# Patient Record
Sex: Female | Born: 1970 | Race: White | Hispanic: No | Marital: Single | Smoking: Former smoker
Health system: Southern US, Community
[De-identification: ages and names within clinical notes are randomized; demographics above are authoritative.]

## PROBLEM LIST (undated history)

## (undated) DIAGNOSIS — I10 Essential (primary) hypertension: Secondary | ICD-10-CM

## (undated) DIAGNOSIS — E042 Nontoxic multinodular goiter: Secondary | ICD-10-CM

## (undated) HISTORY — DX: Nontoxic multinodular goiter: E04.2

---

## 2009-06-29 ENCOUNTER — Ambulatory Visit: Payer: Self-pay | Admitting: Internal Medicine

## 2010-07-05 LAB — TB SKIN TEST: TB Skin Test: NEGATIVE mm

## 2010-12-04 DIAGNOSIS — E042 Nontoxic multinodular goiter: Secondary | ICD-10-CM

## 2010-12-04 HISTORY — DX: Nontoxic multinodular goiter: E04.2

## 2010-12-22 ENCOUNTER — Ambulatory Visit: Payer: Self-pay | Admitting: Internal Medicine

## 2011-01-31 LAB — HM PAP SMEAR: HM Pap smear: NORMAL

## 2011-05-10 ENCOUNTER — Other Ambulatory Visit: Payer: Self-pay | Admitting: *Deleted

## 2011-05-10 NOTE — Telephone Encounter (Signed)
We need to get her chart to confirm medication.

## 2011-05-11 ENCOUNTER — Telehealth: Payer: Self-pay | Admitting: Internal Medicine

## 2011-05-11 NOTE — Telephone Encounter (Signed)
Since I don't have her records available to review,  The only I think I can do safely is to switch her to amlodipine 5 mg  One tablet daily  #30 with 3 refills.

## 2011-05-11 NOTE — Telephone Encounter (Signed)
Pt is about to run out of rx losartan 50mg   Price went from 6 to $50 is there anything else she can take Please advise pt

## 2011-05-11 NOTE — Telephone Encounter (Signed)
Patient called and stated she is running low on her losartan, but the price has went up to $50 a month.  She wanted to know if you could switch her to something similar.

## 2011-05-15 NOTE — Telephone Encounter (Signed)
We can try lisinopril 40 mg one tablet daily  #30 with 2 refills.

## 2011-05-15 NOTE — Telephone Encounter (Signed)
Notified patient since we did not have her records you would prescribe amlodipine.  She stated she has tried that before and it made her ankles swell.  I asked what other medications she has tried before and she stated losartan, amlodipine, and Micardis (her insurance would not cover anymore).  Please advise.

## 2011-05-16 NOTE — Telephone Encounter (Signed)
Pharmacist Thayer Ohm) called stating that the patient and he had called several times about trying to get her Losartan changed to something else. 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 Jama Flavors that a message was left for patient yesterday to call the office back. Thayer Ohm requested the order for the new prescription and stated that he will put it on hold until the patient calls the office back. . New RX given to Thayer Ohm to put on hold until the patient lets him know to fill this.

## 2011-08-08 ENCOUNTER — Other Ambulatory Visit: Payer: Self-pay | Admitting: Internal Medicine

## 2011-08-09 MED ORDER — LISINOPRIL 40 MG PO TABS: 40.0000 mg | ORAL_TABLET | Freq: Every day | ORAL | Status: DC

## 2011-08-16 ENCOUNTER — Encounter: Payer: Self-pay | Admitting: Internal Medicine

## 2011-08-16 ENCOUNTER — Ambulatory Visit (INDEPENDENT_AMBULATORY_CARE_PROVIDER_SITE_OTHER): Payer: Managed Care, Other (non HMO) | Admitting: Internal Medicine

## 2011-08-16 DIAGNOSIS — Z1239 Encounter for other screening for malignant neoplasm of breast: Secondary | ICD-10-CM

## 2011-08-16 DIAGNOSIS — E669 Obesity, unspecified: Secondary | ICD-10-CM

## 2011-08-16 DIAGNOSIS — E042 Nontoxic multinodular goiter: Secondary | ICD-10-CM

## 2011-08-16 DIAGNOSIS — G4700 Insomnia, unspecified: Secondary | ICD-10-CM

## 2011-08-16 DIAGNOSIS — D34 Benign neoplasm of thyroid gland: Secondary | ICD-10-CM

## 2011-08-16 DIAGNOSIS — E785 Hyperlipidemia, unspecified: Secondary | ICD-10-CM

## 2011-08-16 DIAGNOSIS — Z79899 Other long term (current) drug therapy: Secondary | ICD-10-CM

## 2011-08-16 DIAGNOSIS — Z124 Encounter for screening for malignant neoplasm of cervix: Secondary | ICD-10-CM

## 2011-08-16 LAB — BASIC METABOLIC PANEL
BUN: 16 mg/dL (ref 6–23)
Calcium: 8.8 mg/dL (ref 8.4–10.5)
GFR: 83.94 mL/min (ref >60.00)
Glucose, Bld: 91 mg/dL (ref 70–99)
Potassium: 4 mEq/L (ref 3.5–5.1)
Sodium: 139 mEq/L (ref 135–145)

## 2011-08-16 NOTE — Assessment & Plan Note (Signed)
Managed with occasional melatonin  300 mcg

## 2011-08-16 NOTE — Patient Instructions (Signed)
I recommend finding 20 minutes three times week to exercise .     EAS makes a protein shake 2.5 net carbs 110 cal  "Carb control"    Return in 6 months for a physical,  And do fasting labs a day or two prior

## 2011-08-17 ENCOUNTER — Encounter: Payer: Self-pay | Admitting: Internal Medicine

## 2011-08-17 DIAGNOSIS — E669 Obesity, unspecified: Secondary | ICD-10-CM | POA: Insufficient documentation

## 2011-08-17 DIAGNOSIS — Z124 Encounter for screening for malignant neoplasm of cervix: Secondary | ICD-10-CM | POA: Insufficient documentation

## 2011-08-17 DIAGNOSIS — E042 Nontoxic multinodular goiter: Secondary | ICD-10-CM | POA: Insufficient documentation

## 2011-08-17 NOTE — Assessment & Plan Note (Signed)
PAP was normal April 2012, previous one normal 2009

## 2011-08-17 NOTE — Assessment & Plan Note (Addendum)
By April 2012 ultrasound, no calcifications seen.  Normal thyroid function.  She was referred to Dr. Renae Fickle for managementof presume multinodular goiter  and is following up with her every 6 months .  Records not currently available but requested.

## 2011-08-17 NOTE — Progress Notes (Signed)
  Subjective:    Patient ID: Gabrielle Nelson, female    DOB: Apr 01, 1971, 40 y.o.   MRN: [id number]  HPI  Gabrielle Nelson is a 40 yo white female with a recent diagnosis of multinodular goiter, obesity hypertension, and mild hyperlipidemia who is here for 6 months followup.  She feels generally well, has no new issues other than weight gai.  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  She has occasional anxiety  issues but manages them without medications.  She is  not exercising regularly or following any particular diet to lose weight.    Past Medical History  Diagnosis Date  . Multiple thyroid nodules April  2012   Current Outpatient Prescriptions on File Prior to Visit  Medication Sig Dispense Refill  . lisinopril (PRINIVIL,ZESTRIL) 40 MG tablet Take 1 tablet (40 mg total) by mouth daily.  30 tablet  3     Review of Systems  Constitutional: Negative for fever, chills and unexpected weight change.  HENT: Negative for hearing loss, ear pain, nosebleeds, congestion, sore throat, facial swelling, rhinorrhea, sneezing, mouth sores, trouble swallowing, neck pain, neck stiffness, voice change, postnasal drip, sinus pressure, tinnitus and ear discharge.   Eyes: Negative for pain, discharge, redness and visual disturbance.  Respiratory: Negative for cough, chest tightness, shortness of breath, wheezing and stridor.   Cardiovascular: Negative for chest pain, palpitations and leg swelling.  Musculoskeletal: Negative for myalgias and arthralgias.  Skin: Negative for color change and rash.  Neurological: Negative for dizziness, weakness, light-headedness and headaches.  Hematological: Negative for adenopathy.       Objective:   Physical Exam  Constitutional: She is oriented to person, place, and time. Vital signs are normal. She appears well-developed and well-nourished.       obese  HENT:  Mouth/Throat: Oropharynx is clear and moist.  Eyes: EOM are  normal. Pupils are equal, round, and reactive to light. No scleral icterus.  Neck: Normal range of motion. Neck supple. No JVD present. Thyromegaly present.  Cardiovascular: Normal rate, regular rhythm, normal heart sounds and intact distal pulses.   Pulmonary/Chest: Effort normal and breath sounds normal.  Abdominal: Soft. Bowel sounds are normal. She exhibits no mass. There is no tenderness.  Musculoskeletal: Normal range of motion. She exhibits no edema.  Lymphadenopathy:    She has no cervical adenopathy.  Neurological: She is alert and oriented to person, place, and time.  Skin: Skin is warm and dry.  Psychiatric: She has a normal mood and affect.          Assessment & Plan:

## 2011-08-18 DIAGNOSIS — E785 Hyperlipidemia, unspecified: Secondary | ICD-10-CM | POA: Insufficient documentation

## 2011-08-18 NOTE — Assessment & Plan Note (Signed)
Her LDL was 145 in 2011,  With gaol of 100 due to history of hypertension.  Will repeat this month and recommend weight loss vs statin depending on how far beyond goal she is.

## 2011-11-08 ENCOUNTER — Encounter: Payer: Self-pay | Admitting: Internal Medicine

## 2011-11-13 ENCOUNTER — Other Ambulatory Visit: Payer: Self-pay | Admitting: Internal Medicine

## 2011-11-13 LAB — HM MAMMOGRAPHY: HM Mammogram: NORMAL

## 2011-11-13 MED ORDER — LISINOPRIL 40 MG PO TABS: 40.0000 mg | ORAL_TABLET | Freq: Every day | ORAL | Status: DC

## 2011-11-29 ENCOUNTER — Ambulatory Visit: Payer: Self-pay | Admitting: Internal Medicine

## 2011-12-08 ENCOUNTER — Encounter: Payer: Self-pay | Admitting: Internal Medicine

## 2011-12-18 ENCOUNTER — Other Ambulatory Visit: Payer: Self-pay | Admitting: Internal Medicine

## 2011-12-18 MED ORDER — LORATADINE 10 MG PO TABS: 10.0000 mg | ORAL_TABLET | Freq: Every day | ORAL | Status: DC

## 2012-01-03 ENCOUNTER — Ambulatory Visit (INDEPENDENT_AMBULATORY_CARE_PROVIDER_SITE_OTHER): Payer: Managed Care, Other (non HMO) | Admitting: Internal Medicine

## 2012-01-03 DIAGNOSIS — Z111 Encounter for screening for respiratory tuberculosis: Secondary | ICD-10-CM

## 2012-01-04 ENCOUNTER — Telehealth: Payer: Self-pay | Admitting: Internal Medicine

## 2012-01-04 NOTE — Telephone Encounter (Signed)
Patient is needing something for anxiety she has exams coming up.

## 2012-01-04 NOTE — Telephone Encounter (Signed)
Patient is getting ready to go through exams and she is asking if she can get something called in to help with her anxiety.

## 2012-01-05 LAB — TB SKIN TEST: TB Skin Test: NEGATIVE mm

## 2012-01-05 MED ORDER — ALPRAZOLAM 0.25 MG PO TABS: ORAL_TABLET | ORAL | Status: DC

## 2012-01-05 NOTE — Telephone Encounter (Signed)
Alprazolam 0.25 one tablet as needed for anxiety  #30 no refills

## 2012-01-05 NOTE — Telephone Encounter (Signed)
Patient notified of Rx.  

## 2012-01-05 NOTE — Telephone Encounter (Signed)
Rx called to pharmacy. Will notify patient when she comes in today for appt.

## 2012-02-09 ENCOUNTER — Other Ambulatory Visit: Payer: Self-pay | Admitting: Internal Medicine

## 2012-02-09 ENCOUNTER — Other Ambulatory Visit: Payer: Managed Care, Other (non HMO)

## 2012-02-09 MED ORDER — LISINOPRIL 40 MG PO TABS: 40.0000 mg | ORAL_TABLET | Freq: Every day | ORAL | Status: DC

## 2012-02-13 ENCOUNTER — Other Ambulatory Visit (INDEPENDENT_AMBULATORY_CARE_PROVIDER_SITE_OTHER): Payer: Managed Care, Other (non HMO) | Admitting: *Deleted

## 2012-02-13 DIAGNOSIS — E669 Obesity, unspecified: Secondary | ICD-10-CM

## 2012-02-13 DIAGNOSIS — E785 Hyperlipidemia, unspecified: Secondary | ICD-10-CM

## 2012-02-13 DIAGNOSIS — D34 Benign neoplasm of thyroid gland: Secondary | ICD-10-CM

## 2012-02-13 LAB — LIPID PANEL
Cholesterol: 192 mg/dL (ref 0–200)
Triglycerides: 115 mg/dL (ref 0.0–149.0)

## 2012-02-13 LAB — COMPREHENSIVE METABOLIC PANEL
AST: 16 U/L (ref 0–37)
Albumin: 3.6 g/dL (ref 3.5–5.2)
BUN: 14 mg/dL (ref 6–23)
Calcium: 8.6 mg/dL (ref 8.4–10.5)
Chloride: 108 mEq/L (ref 96–112)
Glucose, Bld: 79 mg/dL (ref 70–99)
Potassium: 3.8 mEq/L (ref 3.5–5.1)
Sodium: 141 mEq/L (ref 135–145)
Total Protein: 6.3 g/dL (ref 6.0–8.3)

## 2012-02-15 ENCOUNTER — Ambulatory Visit (INDEPENDENT_AMBULATORY_CARE_PROVIDER_SITE_OTHER): Payer: Managed Care, Other (non HMO) | Admitting: Internal Medicine

## 2012-02-15 ENCOUNTER — Encounter: Payer: Self-pay | Admitting: Internal Medicine

## 2012-02-15 VITALS — BP 116/68 | HR 69 | Temp 98.5°F | Resp 16 | Wt 209.0 lb

## 2012-02-15 DIAGNOSIS — Z0000 Encounter for general adult medical examination without abnormal findings: Secondary | ICD-10-CM

## 2012-02-15 DIAGNOSIS — E785 Hyperlipidemia, unspecified: Secondary | ICD-10-CM

## 2012-02-15 NOTE — Patient Instructions (Addendum)
Consider the Low Glycemic Index Diet and 6 smaller meals daily .  This boosts your metabolism and regulates your sugars:   7 AM Low carbohydrate Protein  Shakes (EAS Carb Control  Or Atkins ,  Available everywhere,   In  cases at BJs )  2.5 carbs  (Add or substitute a toasted sandwhich thin w/ peanut butter)  10 AM: Protein bar by Atkins (snack size,  Chocolate lover's variety at  BJ's)    Lunch: sandwich on pita bread or flatbread (Joseph's makes a pita bread and a flat bread , available at [employer] and BJ's; Toufayah makes a low carb flatbread available at [employer] and [employer]) Mission makes a low carb whole wheat tortilla available at BJs,and most grocery stores   3 PM:  Mid day :  Another protein bar,  Or a  cheese stick, 1/4 cup of almonds, walnuts, pistachios, pecans, peanuts,  Macadamia nuts  6 PM  Dinner:  "mean and green:"  Meat/chicken/fish, salad, and green veggie : use ranch, vinagrette,  Blue cheese, etc  9 PM snack : Breyer's low carb fudgsicle or  ice cream bar (Carb Smart), or  Weight Watcher's ice cream bar , or another protein shake  

## 2012-02-15 NOTE — Assessment & Plan Note (Addendum)
recommended trial of red yeast rice 600 mg twice daily

## 2012-02-18 ENCOUNTER — Encounter: Payer: Self-pay | Admitting: Internal Medicine

## 2012-02-18 NOTE — Progress Notes (Signed)
  Subjective:     Gabrielle Nelson is a 41 y.o. female here for a routine exam.  Current complaints:  none.  Personal health questionnaire reviewed: yes.   Gynecologic History Patient's last menstrual period was 01/29/2012. Contraception: vasectomy Last Pap: April 2012. Results were: normal Last mammogram: Dec 2012. Results were: normal  Obstetric History OB History    Grav Para Term Preterm Abortions TAB SAB Ect Mult Living                   The following portions of the patient's history were reviewed and updated as appropriate: allergies, current medications, past family history, past medical history, past social history, past surgical history and problem list.  Review of Systems A comprehensive review of systems was negative.    Objective:    BP 116/68  Pulse 69  Temp 98.5 F (36.9 C) (Oral)  Resp 16  Wt 209 lb (94.802 kg)  SpO2 98%  LMP 01/29/2012  General Appearance:    Alert, cooperative, no distress, appears stated age  Head:    Normocephalic, without obvious abnormality, atraumatic  Eyes:    PERRL, conjunctiva/corneas clear, EOM's intact, fundi    benign, both eyes  Ears:    Normal TM's and external ear canals, both ears  Nose:   Nares normal, septum midline, mucosa normal, no drainage    or sinus tenderness  Throat:   Lips, mucosa, and tongue normal; teeth and gums normal  Neck:   Supple, symmetrical, trachea midline, no adenopathy;    thyroid:  no enlargement/tenderness/nodules; no carotid   bruit or JVD  Back:     Symmetric, no curvature, ROM normal, no CVA tenderness  Lungs:     Clear to auscultation bilaterally, respirations unlabored  Chest Wall:    No tenderness or deformity   Heart:    Regular rate and rhythm, S1 and S2 normal, no murmur, rub   or gallop  Breast Exam:    No tenderness, masses, or nipple abnormality  Abdomen:     Soft, non-tender, bowel sounds active all four quadrants,    no masses, no organomegaly  Genitalia:    Normal female without  lesion, discharge or tenderness  Rectal:    Normal tone, normal prostate, no masses or tenderness;   guaiac negative stool  Extremities:   Extremities normal, atraumatic, no cyanosis or edema  Pulses:   2+ and symmetric all extremities  Skin:   Skin color, texture, turgor normal, no rashes or lesions  Lymph nodes:   Cervical, supraclavicular, and axillary nodes normal  Neurologic:   CNII-XII intact, normal strength, sensation and reflexes    throughout      Assessment:    Healthy female exam.    Plan:    Education reviewed: low fat, low cholesterol diet and weight bearing exercise.

## 2012-05-14 ENCOUNTER — Other Ambulatory Visit: Payer: Self-pay | Admitting: Internal Medicine

## 2012-05-14 MED ORDER — LISINOPRIL 40 MG PO TABS: 40.0000 mg | ORAL_TABLET | Freq: Every day | ORAL | Status: DC

## 2012-08-15 ENCOUNTER — Other Ambulatory Visit: Payer: Self-pay

## 2012-08-15 MED ORDER — LISINOPRIL 40 MG PO TABS: 40.0000 mg | ORAL_TABLET | Freq: Every day | ORAL | Status: DC

## 2012-08-15 NOTE — Telephone Encounter (Signed)
Refill request for Lisinopril 40 mg # 30 3 R sent to [facility]

## 2012-11-11 ENCOUNTER — Other Ambulatory Visit: Payer: Self-pay | Admitting: Internal Medicine

## 2012-11-12 NOTE — Telephone Encounter (Signed)
Med filled with no refills. Pt due for appt for additional.

## 2012-11-19 ENCOUNTER — Encounter: Payer: Managed Care, Other (non HMO) | Admitting: Adult Health

## 2012-11-25 ENCOUNTER — Encounter: Payer: Self-pay | Admitting: Adult Health

## 2012-11-25 ENCOUNTER — Ambulatory Visit (INDEPENDENT_AMBULATORY_CARE_PROVIDER_SITE_OTHER): Payer: 59 | Admitting: Adult Health

## 2012-11-25 VITALS — BP 130/87 | HR 87 | Temp 97.6°F | Resp 14 | Ht 64.0 in | Wt 209.0 lb

## 2012-11-25 DIAGNOSIS — Z23 Encounter for immunization: Secondary | ICD-10-CM

## 2012-11-25 DIAGNOSIS — Z020 Encounter for examination for admission to educational institution: Secondary | ICD-10-CM | POA: Insufficient documentation

## 2012-11-25 DIAGNOSIS — Z0000 Encounter for general adult medical examination without abnormal findings: Secondary | ICD-10-CM

## 2012-11-25 LAB — URINALYSIS, ROUTINE W REFLEX MICROSCOPIC
Specific Gravity, Urine: >=1.03 (ref 1.000–1.030)
Total Protein, Urine: NEGATIVE
Urine Glucose: NEGATIVE

## 2012-11-25 LAB — CBC
Platelets: 238 10*3/uL (ref 150.0–400.0)
RBC: 4.69 Mil/uL (ref 3.87–5.11)
WBC: 5.6 10*3/uL (ref 4.5–10.5)

## 2012-11-25 NOTE — Assessment & Plan Note (Addendum)
Patient had previous physical exam in May 2013. Normal findings on exam today. Vision test performed. Right eye 20/25, left eye 20/30. Administer PPD. She will need to return within 48-72 hours for results. She was also given Tdap vaccine today. Hepatitis B titers drawn. Patient also needed CBC and urinalysis. Physical examination forearm and immunization record form filled out for [employer].

## 2012-11-25 NOTE — Patient Instructions (Addendum)
  Tdap received today.  PPD given. Please return within 48-72 hours for reading. If this is not read within this time frame your PPD will need to be repeated.  Please have your labs drawn prior to leaving the office.

## 2012-11-25 NOTE — Addendum Note (Signed)
Addended by: Algis Downs on: 11/25/2012 01:20 PM   Modules accepted: Orders

## 2012-11-25 NOTE — Progress Notes (Signed)
  Subjective:    Patient ID: Gabrielle Nelson, female    DOB: [birth year], 42 y.o.   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 She does not have any immunization records with her. We do not have a record of her Tdap or hepatitis vaccine. Previous PPD done 01/2012.    Current Outpatient Prescriptions on File Prior to Visit  Medication Sig Dispense Refill  . fish oil-omega-3 fatty acids 1000 MG capsule Take 2 g by mouth daily.        Marland Kitchen glucosamine-chondroitin 500-400 MG tablet Take 1 tablet by mouth 3 (three) times daily.        Marland Kitchen lisinopril (PRINIVIL,ZESTRIL) 40 MG tablet TAKE ONE TABLET BY MOUTH EVERY DAY  90 tablet  0  . loratadine (CLARITIN) 10 MG tablet Take 1 tablet (10 mg total) by mouth daily.  90 tablet  3  . naproxen sodium (ANAPROX) 220 MG tablet Take 220 mg by mouth daily.       . ranitidine (ZANTAC) 150 MG capsule Take 150 mg by mouth 2 (two) times daily.         No current facility-administered medications on file prior to visit.     Review of Systems  Constitutional: Negative.   HENT: Negative.   Eyes: Negative.   Respiratory: Negative.   Cardiovascular: Negative.   Gastrointestinal: Negative.   Endocrine: Negative.   Genitourinary: Negative.   Musculoskeletal: Negative.   Skin: Negative.   Neurological: Negative.   Psychiatric/Behavioral: Negative.     BP 130/87  Pulse 87  Temp(Src) 97.6 F (36.4 C) (Oral)  Ht 5\' 4"  (1.626 m)  Wt 209 lb (94.802 kg)  BMI 35.86 kg/m2  SpO2 99%  LMP 11/17/2012    Objective:   Physical Exam  Constitutional: She is oriented to person, place, and time. She appears well-developed and well-nourished. No distress.  HENT:  Head: Normocephalic and atraumatic.  Eyes: Conjunctivae are normal. Pupils are equal, round, and reactive to light.  Neck: Normal range of motion. Neck supple.  Cardiovascular: Normal rate, regular rhythm and normal heart sounds.   Exam reveals no gallop.   No murmur heard. Pulmonary/Chest: Effort normal and breath sounds normal. No respiratory distress. She has no wheezes. She has no rales.  Abdominal: Soft. Bowel sounds are normal.  Lymphadenopathy:    She has no cervical adenopathy.  Neurological: She is alert and oriented to person, place, and time.  Skin: Skin is warm and dry.  Psychiatric: She has a normal mood and affect. Her behavior is normal. Judgment and thought content normal.       Assessment & Plan:

## 2012-11-26 LAB — HEPATITIS B SURFACE ANTIBODY,QUALITATIVE: Hep B S Ab: REACTIVE — AB

## 2012-12-24 ENCOUNTER — Other Ambulatory Visit: Payer: Self-pay | Admitting: *Deleted

## 2012-12-24 MED ORDER — LORATADINE 10 MG PO TABS: 10.0000 mg | ORAL_TABLET | Freq: Every day | ORAL | Status: DC

## 2013-02-07 ENCOUNTER — Other Ambulatory Visit: Payer: Self-pay | Admitting: Internal Medicine

## 2013-08-03 ENCOUNTER — Other Ambulatory Visit: Payer: Self-pay | Admitting: Internal Medicine

## 2013-11-03 ENCOUNTER — Other Ambulatory Visit: Payer: Self-pay | Admitting: Internal Medicine

## 2013-11-05 ENCOUNTER — Other Ambulatory Visit: Payer: Self-pay | Admitting: Internal Medicine

## 2013-12-02 ENCOUNTER — Other Ambulatory Visit: Payer: Self-pay | Admitting: Internal Medicine

## 2013-12-02 NOTE — Telephone Encounter (Signed)
Appt 01/08/14 

## 2013-12-29 ENCOUNTER — Other Ambulatory Visit: Payer: Self-pay | Admitting: *Deleted

## 2013-12-29 MED ORDER — LORATADINE 10 MG PO TABS: ORAL_TABLET | ORAL | Status: DC

## 2013-12-29 NOTE — Telephone Encounter (Signed)
Appt 01/08/14

## 2014-01-08 ENCOUNTER — Encounter: Payer: Self-pay | Admitting: Internal Medicine

## 2014-01-08 ENCOUNTER — Ambulatory Visit (INDEPENDENT_AMBULATORY_CARE_PROVIDER_SITE_OTHER): Payer: BC Managed Care – PPO | Admitting: Internal Medicine

## 2014-01-08 ENCOUNTER — Other Ambulatory Visit: Payer: Self-pay | Admitting: Internal Medicine

## 2014-01-08 ENCOUNTER — Encounter (INDEPENDENT_AMBULATORY_CARE_PROVIDER_SITE_OTHER): Payer: Self-pay

## 2014-01-08 VITALS — BP 120/80 | HR 67 | Temp 98.0°F | Ht 64.75 in | Wt 200.0 lb

## 2014-01-08 DIAGNOSIS — Z0000 Encounter for general adult medical examination without abnormal findings: Secondary | ICD-10-CM

## 2014-01-08 DIAGNOSIS — R5383 Other fatigue: Principal | ICD-10-CM

## 2014-01-08 DIAGNOSIS — E669 Obesity, unspecified: Secondary | ICD-10-CM

## 2014-01-08 DIAGNOSIS — E785 Hyperlipidemia, unspecified: Secondary | ICD-10-CM

## 2014-01-08 DIAGNOSIS — R5381 Other malaise: Secondary | ICD-10-CM

## 2014-01-08 LAB — COMPREHENSIVE METABOLIC PANEL
ALT: 10 U/L (ref 0–35)
AST: 14 U/L (ref 0–37)
Albumin: 3.7 g/dL (ref 3.5–5.2)
Alkaline Phosphatase: 46 U/L (ref 39–117)
BILIRUBIN TOTAL: 0.4 mg/dL (ref 0.2–1.2)
BUN: 18 mg/dL (ref 6–23)
CO2: 26 mEq/L (ref 19–32)
CREATININE: 0.8 mg/dL (ref 0.4–1.2)
Calcium: 8.8 mg/dL (ref 8.4–10.5)
Chloride: 106 mEq/L (ref 96–112)
GFR: 82.98 mL/min (ref >60.00)
Glucose, Bld: 75 mg/dL (ref 70–99)
Potassium: 3.6 mEq/L (ref 3.5–5.1)
SODIUM: 138 meq/L (ref 135–145)
Total Protein: 6.3 g/dL (ref 6.0–8.3)

## 2014-01-08 LAB — LIPID PANEL
CHOL/HDL RATIO: 5
Cholesterol: 194 mg/dL (ref 0–200)
HDL: 42.1 mg/dL (ref >39.00)
LDL Cholesterol: 133 mg/dL — ABNORMAL HIGH (ref 0–99)
Triglycerides: 94 mg/dL (ref 0.0–149.0)
VLDL: 18.8 mg/dL (ref 0.0–40.0)

## 2014-01-08 LAB — CBC WITH DIFFERENTIAL/PLATELET
Basophils Absolute: 0 10*3/uL (ref 0.0–0.1)
Basophils Relative: 0.6 % (ref 0.0–3.0)
EOS PCT: 3.1 % (ref 0.0–5.0)
Eosinophils Absolute: 0.2 10*3/uL (ref 0.0–0.7)
HEMATOCRIT: 37.9 % (ref 36.0–46.0)
HEMOGLOBIN: 12.9 g/dL (ref 12.0–15.0)
Lymphocytes Relative: 31.2 % (ref 12.0–46.0)
Lymphs Abs: 1.6 10*3/uL (ref 0.7–4.0)
MCHC: 34.1 g/dL (ref 30.0–36.0)
MCV: 83.7 fl (ref 78.0–100.0)
MONO ABS: 0.3 10*3/uL (ref 0.1–1.0)
MONOS PCT: 6.1 % (ref 3.0–12.0)
NEUTROS ABS: 3 10*3/uL (ref 1.4–7.7)
Neutrophils Relative %: 59 % (ref 43.0–77.0)
Platelets: 220 10*3/uL (ref 150.0–400.0)
RBC: 4.53 Mil/uL (ref 3.87–5.11)
RDW: 13.5 % (ref 11.5–15.5)
WBC: 5 10*3/uL (ref 4.0–10.5)

## 2014-01-08 LAB — TSH: TSH: 0.67 u[IU]/mL (ref 0.35–4.50)

## 2014-01-08 NOTE — Progress Notes (Signed)
Pre visit review using our clinic review tool, if applicable. No additional management support is needed unless otherwise documented below in the visit note. 

## 2014-01-08 NOTE — Assessment & Plan Note (Signed)
I have addressed  BMI and recommended a low glycemic index diet utilizing smaller more frequent meals to increase metabolism.  I have also recommended that patient start exercising with a goal of 30 minutes of aerobic exercise a minimum of 5 days per week. Screening for lipid disorders, thyroid and diabetes to be done today.   

## 2014-01-08 NOTE — Patient Instructions (Signed)
You had your annual  wellness exam today.  We will repeat your PAP smear  This year    We will schedule your mammogram soon at [facility].  We will contact you with the bloodwork results  This is  One version of a  "Low GI"  Diet:  It will still lower your blood sugars and allow you to lose 4 to 8  lbs  per month if you follow it carefully.  Your goal with exercise is a minimum of 30 minutes of aerobic exercise 5 days per week (Walking does not count once it becomes easy!)    All of the foods can be found at grocery stores and in bulk at [employer].  The Atkins protein bars and shakes are available in more varieties at Target, WalMart and [place].     7 AM Breakfast:  Choose from the following:  Low carbohydrate Protein  Shakes (I recommend the EAS AdvantEdge "Carb Control" shakes  Or the low carb shakes by Atkins.    2.5 carbs   Arnold's "Sandwhich Thin"toasted  w/ peanut butter (no jelly: about 20 net carbs  "Bagel Thin" with cream cheese and salmon: about 20 carbs   a scrambled egg/bacon/cheese burrito made with Mission's "carb balance" whole wheat tortilla  (about 10 net carbs )   Avoid cereal and bananas, oatmeal and cream of wheat and grits. They are loaded with carbohydrates!   10 AM: high protein snack  Protein bar by Atkins (the snack size, under 200 cal, usually < 6 net carbs).    A stick of cheese:  Around 1 carb,  100 cal     Dannon Light n Fit [place] Yogurt  (80 cal, 8 carbs)  Other so called "protein bars" and Greek yogurts tend to be loaded with carbohydrates.  Remember, in food advertising, the word "energy" is synonymous for " carbohydrate."  Lunch:   A Sandwich using the bread choices listed, Can use any  Eggs,  lunchmeat, grilled meat or canned tuna), avocado, regular mayo/mustard  and cheese.  A Salad using blue cheese, ranch,  Goddess or vinagrette,  No croutons or "confetti" and no "candied nuts" but regular nuts OK.   No pretzels or chips.  Pickles and miniature  sweet peppers are a good low carb alternative that provide a "crunch"  The bread is the only source of carbohydrate in a sandwich and  can be decreased by trying some of these alternatives to traditional loaf bread  Joseph's makes a pita bread and a flat bread that are 50 cal and 4 net carbs available at [place] and [place].  This can be toasted to use with hummous as well  Toufayan makes a low carb flatbread that's 100 cal and 9 net carbs available at [employer] and BJ's makes 2 sizes of  Low carb whole wheat tortilla  (The large one is 210 cal and 6 net carbs) Avoid "Low fat dressings, as well as Barry Brunner and [place] dressings They are loaded with sugar!   3 PM/ Mid day  Snack:  Consider  1 ounce of  almonds, walnuts, pistachios, pecans, peanuts,  Macadamia nuts or a nut medley.  Avoid "granola"; the dried cranberries and raisins are loaded with carbohydrates. Mixed nuts as long as there are no raisins,  cranberries or dried fruit.     6 PM  Dinner:     Meat/fowl/fish with a green salad, and either broccoli, cauliflower, green beans, spinach, brussel sprouts  or  Lima beans. DO NOT BREAD THE PROTEIN!!      There is a low carb pasta by Dreamfield's that is acceptable and tastes great: only 5 digestible carbs/serving.( All grocery stores but BJs carry it )  Try Hurley Cisco Angelo's chicken piccata or chicken or eggplant parm over low carb pasta.(Lowes and BJs)   Marjory Lies Sanchez's "Carnitas" (pulled pork, no sauce,  0 carbs) or his beef pot roast to make a dinner burrito (at BJ's)  Pesto over low carb pasta (bj's sells a good quality pesto in the center refrigerated section of the deli   Whole wheat pasta is still full of digestible carbs and  Not as low in glycemic index as Dreamfield's.   Brown rice is still rice,  So skip the rice and noodles if you eat [place] or [place] (or at least limit to 1/2 cup)  9 PM snack :   Breyer's "low carb" fudgsicle or  ice cream bar (Carb Smart line), or   Weight Watcher's ice cream bar , or another "no sugar added" ice cream;  a serving of fresh berries/cherries with whipped cream   Cheese or DANNON'S LlGHT N FIT [place] YOGURT  Avoid bananas, pineapple, grapes  and watermelon on a regular basis because they are high in sugar.  THINK OF THEM AS DESSERT  Remember that snack Substitutions should be less than 10 NET carbs per serving and meals < 20 carbs. Remember to subtract fiber grams to get the "net carbs."

## 2014-01-11 NOTE — Progress Notes (Signed)
Patient ID: Gabrielle Nelson, female   DOB: 18-Feb-1971, 43 y.o.   MRN: [id number]  Subjective:     Gabrielle Nelson is a 43 y.o. female and is here for a comprehensive physical exam. The patient reports trouble losing weight.  History   Social History  . Marital Status: Single    Spouse Name: N/A    Number of Children: N/A  . Years of Education: N/A   Occupational History  . Not on file.   Social History Main Topics  . Smoking status: Former Smoker    Quit date: 08/15/2005  . Smokeless tobacco: Never Used  . Alcohol Use: No  . Drug Use: No  . Sexual Activity: Not on file   Other Topics Concern  . Not on file   Social History Narrative  . No narrative on file   Health Maintenance  Topic Date Due  . Pap Smear  08/16/2013  . Influenza Vaccine  04/04/2014  . Tetanus/tdap  11/26/2022    The following portions of the patient's history were reviewed and updated as appropriate: allergies, current medications, past family history, past medical history, past social history, past surgical history and problem list.  Review of Systems A comprehensive review of systems was negative.   Objective:  BP 120/80  Pulse 67  Temp(Src) 98 F (36.7 C) (Oral)  Ht 5' 4.75" (1.645 m)  Wt 200 lb (90.719 kg)  BMI 33.52 kg/m2  SpO2 98%  LMP 01/08/2014  General appearance: alert, cooperative and appears stated age Head: Normocephalic, without obvious abnormality, atraumatic Eyes: conjunctivae/corneas clear. PERRL, EOM's intact. Fundi benign. Ears: normal TM's and external ear canals both ears Nose: Nares normal. Septum midline. Mucosa normal. No drainage or sinus tenderness. Throat: lips, mucosa, and tongue normal; teeth and gums normal Neck: no adenopathy, no carotid bruit, no JVD, supple, symmetrical, trachea midline and thyroid not enlarged, symmetric, no tenderness/mass/nodules Lungs: clear to auscultation bilaterally Breasts: normal appearance, no masses or tenderness Heart: regular rate  and rhythm, S1, S2 normal, no murmur, click, rub or gallop Abdomen: soft, non-tender; bowel sounds normal; no masses,  no organomegaly Extremities: extremities normal, atraumatic, no cyanosis or edema Pulses: 2+ and symmetric Skin: Skin color, texture, turgor normal. No rashes or lesions Neurologic: Alert and oriented X 3, normal strength and tone. Normal symmetric reflexes. Normal coordination and gait.   .    Assessment and Plan:    Obesity (BMI 30-39.9) I have addressed  BMI and recommended a low glycemic index diet utilizing smaller more frequent meals to increase metabolism.  I have also recommended that patient start exercising with a goal of 30 minutes of aerobic exercise a minimum of 5 days per week. Screening for lipid disorders, thyroid and diabetes to be done today.    Routine general medical examination at a health care facility Annual comprehensive exam was done including breast, excluding pelvic and PAP smear since she is currently menstruating . All screenings have been addressed .    Updated Medication List Outpatient Encounter Prescriptions as of 01/08/2014  Medication Sig  . fish oil-omega-3 fatty acids 1000 MG capsule Take 1,200 mg by mouth 4 (four) times daily.   Marland Kitchen glucosamine-chondroitin 500-400 MG tablet Take 2 tablets by mouth daily.   . naproxen sodium (ANAPROX) 220 MG tablet Take 220 mg by mouth daily.   . ranitidine (ZANTAC) 150 MG capsule Take 150 mg by mouth daily.   . [DISCONTINUED] lisinopril (PRINIVIL,ZESTRIL) 40 MG tablet TAKE ONE TABLET BY MOUTH EVERY DAY  . [  DISCONTINUED] loratadine (CLARITIN) 10 MG tablet TAKE ONE TABLET BY MOUTH EVERY DAY

## 2014-01-11 NOTE — Assessment & Plan Note (Signed)
Annual comprehensive exam was done including breast, excluding pelvic and PAP smear since she is currently menstruating . All screenings have been addressed .

## 2014-01-12 ENCOUNTER — Encounter: Payer: Self-pay | Admitting: *Deleted

## 2014-02-19 ENCOUNTER — Other Ambulatory Visit (HOSPITAL_COMMUNITY)
Admission: RE | Admit: 2014-02-19 | Discharge: 2014-02-19 | Disposition: A | Discharge status: Home / Self Care | Payer: BC Managed Care – PPO | Source: Ambulatory Visit | Attending: Internal Medicine | Admitting: Internal Medicine

## 2014-02-19 ENCOUNTER — Ambulatory Visit (INDEPENDENT_AMBULATORY_CARE_PROVIDER_SITE_OTHER): Payer: BC Managed Care – PPO | Admitting: Internal Medicine

## 2014-02-19 ENCOUNTER — Encounter: Payer: Self-pay | Admitting: Internal Medicine

## 2014-02-19 VITALS — BP 116/72 | HR 76 | Temp 97.9°F | Resp 18 | Ht 64.75 in | Wt 205.0 lb

## 2014-02-19 DIAGNOSIS — R0989 Other specified symptoms and signs involving the circulatory and respiratory systems: Secondary | ICD-10-CM

## 2014-02-19 DIAGNOSIS — R0609 Other forms of dyspnea: Secondary | ICD-10-CM

## 2014-02-19 DIAGNOSIS — G4710 Hypersomnia, unspecified: Secondary | ICD-10-CM

## 2014-02-19 DIAGNOSIS — Z124 Encounter for screening for malignant neoplasm of cervix: Secondary | ICD-10-CM

## 2014-02-19 DIAGNOSIS — G4719 Other hypersomnia: Secondary | ICD-10-CM

## 2014-02-19 DIAGNOSIS — Z1151 Encounter for screening for human papillomavirus (HPV): Secondary | ICD-10-CM | POA: Insufficient documentation

## 2014-02-19 DIAGNOSIS — E669 Obesity, unspecified: Secondary | ICD-10-CM

## 2014-02-19 DIAGNOSIS — G4700 Insomnia, unspecified: Secondary | ICD-10-CM

## 2014-02-19 DIAGNOSIS — R0683 Snoring: Secondary | ICD-10-CM

## 2014-02-19 MED ORDER — ALPRAZOLAM 0.5 MG PO TABS: 0.5000 mg | ORAL_TABLET | Freq: Every evening | ORAL | Status: DC | PRN

## 2014-02-19 NOTE — Patient Instructions (Signed)
I am ordering a sleep study based on your symptoms of snoring,  Daytime fatigue and nonrestorative sleep  You can try the alprazolam at bedtime to help you fall asleep. Take it with you to the sleep lab on the night of your study   We will contact you with the results of your PAP smear next week

## 2014-02-19 NOTE — Progress Notes (Signed)
Pre-visit discussion using our clinic review tool. No additional management support is needed unless otherwise documented below in the visit note.  

## 2014-02-19 NOTE — Assessment & Plan Note (Addendum)
No improvement except with 10 melatonin which makes her groggy in the am/..  Told she snores,  No prior sleep study , has obesity, daytime fatigue and sleepiness and wakes up tired every am.  Sleep study ordered,  Alprazolam 0.5 mg gi ven

## 2014-02-19 NOTE — Assessment & Plan Note (Addendum)
PAP smear was done  today .  Exam appeared normal

## 2014-02-20 LAB — CYTOLOGY - PAP

## 2014-02-22 ENCOUNTER — Encounter: Payer: Self-pay | Admitting: Internal Medicine

## 2014-02-22 NOTE — Assessment & Plan Note (Signed)
With snoring, hypertension and fatigue. Discussed referral for sleep study

## 2014-02-22 NOTE — Progress Notes (Signed)
Patient ID: Gabrielle Nelson, female   DOB: 1971/01/10, 43 y.o.   MRN: [id number]   Patient Active Problem List   Diagnosis Date Noted  . School physical exam 11/25/2012  . Routine general medical examination at a health care facility 02/15/2012  . Other and unspecified hyperlipidemia 08/18/2011  . Pap smear for cervical cancer screening 08/17/2011  . Obesity (BMI 30-39.9) 08/17/2011  . Multiple thyroid nodules   . Insomnia 08/16/2011    Subjective:  CC:   Chief Complaint  Patient presents with  . Follow-up    from physical for pap.    HPI:   Gabrielle Nelson is a 43 y.o. female who presents for  PAP smear which was deferred last month during her annual PE due to menstruation.  New complaints of insomnia, and morning fatigue. Has history of snoring , weight gain, daytime fatigue    Past Medical History  Diagnosis Date  . Multiple thyroid nodules April  2012    History reviewed. No pertinent past surgical history.     The following portions of the patient's history were reviewed and updated as appropriate: Allergies, current medications, and problem list.    Review of Systems:   Patient denies headache, fevers, malaise, unintentional weight loss, skin rash, eye pain, sinus congestion and sinus pain, sore throat, dysphagia,  hemoptysis , cough, dyspnea, wheezing, chest pain, palpitations, orthopnea, edema, abdominal pain, nausea, melena, diarrhea, constipation, flank pain, dysuria, hematuria, urinary  Frequency, nocturia, numbness, tingling, seizures,  Focal weakness, Loss of consciousness,  Tremor, insomnia, depression, anxiety, and suicidal ideation.     History   Social History  . Marital Status: Single    Spouse Name: N/A    Number of Children: N/A  . Years of Education: N/A   Occupational History  . Not on file.   Social History Main Topics  . Smoking status: Former Smoker    Quit date: 08/15/2005  . Smokeless tobacco: Never Used  . Alcohol Use: No  . Drug  Use: No  . Sexual Activity: Not on file   Other Topics Concern  . Not on file   Social History Narrative  . No narrative on file    Objective:  Filed Vitals:   02/19/14 1108  BP: 116/72  Pulse: 76  Temp: 97.9 F (36.6 C)  Resp: 18   General Appearance:    Alert, cooperative, no distress, appears stated age  Head:    Normocephalic, without obvious abnormality, atraumatic  Eyes:    PERRL, conjunctiva/corneas clear, EOM's intact, fundi    benign, both eyes  Ears:    Normal TM's and external ear canals, both ears  Nose:   Nares normal, septum midline, mucosa normal, no drainage    or sinus tenderness  Throat:   Lips, mucosa, and tongue normal; teeth and gums normal  Neck:   Supple, symmetrical, trachea midline, no adenopathy;    thyroid:  no enlargement/tenderness/nodules; no carotid   bruit or JVD  Back:     Symmetric, no curvature, ROM normal, no CVA tenderness  Lungs:     Clear to auscultation bilaterally, respirations unlabored  Chest Wall:    No tenderness or deformity   Heart:    Regular rate and rhythm, S1 and S2 normal, no murmur, rub   or gallop  Breast Exam:    No tenderness, masses, or nipple abnormality  Abdomen:     Soft, non-tender, bowel sounds active all four quadrants,    no masses, no organomegaly  Genitalia:  Pelvic: cervix normal in appearance, external genitalia normal, no adnexal masses or tenderness, no cervical motion tenderness, rectovaginal septum normal, uterus normal size, shape, and consistency and vagina normal without discharge  Extremities:   Extremities normal, atraumatic, no cyanosis or edema  Pulses:   2+ and symmetric all extremities  Skin:   Skin color, texture, turgor normal, no rashes or lesions  Lymph nodes:   Cervical, supraclavicular, and axillary nodes normal  Neurologic:   CNII-XII intact, normal strength, sensation and reflexes    throughout   Assessment and Plan:  Pap smear for cervical cancer screening PAP smear was done   today .  Exam appeared normal  Insomnia No improvement except with 10 melatonin which makes her groggy in the am/..  Told she snores,  No prior sleep study , has obesity, daytime fatigue and sleepiness and wakes up tired every am.  Sleep study ordered,  Alprazolam 0.5 mg gi ven   Obesity (BMI 30-39.9) With snoring, hypertension and fatigue. Discussed referral for sleep study     Updated Medication List Outpatient Encounter Prescriptions as of 02/19/2014  Medication Sig  . fish oil-omega-3 fatty acids 1000 MG capsule Take 1,200 mg by mouth 4 (four) times daily.   Marland Kitchen glucosamine-chondroitin 500-400 MG tablet Take 2 tablets by mouth daily.   Marland Kitchen lisinopril (PRINIVIL,ZESTRIL) 40 MG tablet TAKE ONE TABLET BY MOUTH EVERY DAY  . loratadine (CLARITIN) 10 MG tablet TAKE ONE TABLET BY MOUTH EVERY DAY  . naproxen sodium (ANAPROX) 220 MG tablet Take 220 mg by mouth daily.   . ranitidine (ZANTAC) 150 MG capsule Take 150 mg by mouth daily.   Marland Kitchen ALPRAZolam (XANAX) 0.5 MG tablet Take 1 tablet (0.5 mg total) by mouth at bedtime as needed for anxiety or sleep.     Orders Placed This Encounter  Procedures  . Ambulatory referral to Sleep Studies    No Follow-up on file.

## 2014-02-23 LAB — HM PAP SMEAR: HM Pap smear: NORMAL

## 2014-02-24 ENCOUNTER — Encounter: Payer: Self-pay | Admitting: *Deleted

## 2014-06-05 ENCOUNTER — Other Ambulatory Visit: Payer: Self-pay | Admitting: Internal Medicine

## 2014-06-28 ENCOUNTER — Other Ambulatory Visit: Payer: Self-pay | Admitting: Internal Medicine

## 2014-08-24 ENCOUNTER — Encounter: Payer: Self-pay | Admitting: Internal Medicine

## 2014-08-24 ENCOUNTER — Ambulatory Visit (INDEPENDENT_AMBULATORY_CARE_PROVIDER_SITE_OTHER): Payer: BC Managed Care – PPO | Admitting: Internal Medicine

## 2014-08-24 VITALS — BP 112/74 | HR 83 | Temp 98.2°F | Resp 14 | Ht 64.75 in | Wt 209.8 lb

## 2014-08-24 DIAGNOSIS — J069 Acute upper respiratory infection, unspecified: Secondary | ICD-10-CM

## 2014-08-24 DIAGNOSIS — B9789 Other viral agents as the cause of diseases classified elsewhere: Secondary | ICD-10-CM

## 2014-08-24 DIAGNOSIS — Z1239 Encounter for other screening for malignant neoplasm of breast: Secondary | ICD-10-CM

## 2014-08-24 DIAGNOSIS — E042 Nontoxic multinodular goiter: Secondary | ICD-10-CM

## 2014-08-24 DIAGNOSIS — I10 Essential (primary) hypertension: Secondary | ICD-10-CM

## 2014-08-24 DIAGNOSIS — G4700 Insomnia, unspecified: Secondary | ICD-10-CM

## 2014-08-24 DIAGNOSIS — E669 Obesity, unspecified: Secondary | ICD-10-CM

## 2014-08-24 LAB — BASIC METABOLIC PANEL
BUN: 15 mg/dL (ref 6–23)
CALCIUM: 8.6 mg/dL (ref 8.4–10.5)
CO2: 25 mEq/L (ref 19–32)
CREATININE: 0.8 mg/dL (ref 0.4–1.2)
Chloride: 105 mEq/L (ref 96–112)
GFR: 85.19 mL/min (ref >60.00)
Glucose, Bld: 92 mg/dL (ref 70–99)
Potassium: 4 mEq/L (ref 3.5–5.1)
Sodium: 140 mEq/L (ref 135–145)

## 2014-08-24 MED ORDER — RANITIDINE HCL 150 MG PO CAPS: 150.0000 mg | ORAL_CAPSULE | Freq: Every day | ORAL | Status: DC

## 2014-08-24 MED ORDER — MELOXICAM 15 MG PO TABS: 15.0000 mg | ORAL_TABLET | Freq: Every day | ORAL | Status: DC

## 2014-08-24 NOTE — Patient Instructions (Signed)
You have a viral syndrome which is causing  Your laryngitis because of post nasal drip.     The post nasal drip may alsoe be contributing to  your  Cough.   I also advise use of the following OTC meds to help with  symptoms.   Take generic OTC benadryl 25 mg  At bedtime for the drainage,,  Delsym for daytime cough. flush your sinuses twice daily with Simply Saline     I want you to lose 4 to 8  lbs  per month using a low glycemic index diet and exercise  Your goal with exercise is a minimum of 30 minutes of aerobic exercise 5 days per week (Walking does not count once it becomes easy!)    All of the foods can be found at grocery stores and in bulk at [employer].  The Atkins protein bars and shakes are available in more varieties at [employer], [employer] and [place].     7 AM Breakfast:  Choose from the following:  Low carbohydrate Protein  Shakes (I recommend the EAS AdvantEdge "Carb Control" shakes  Or the low carb shakes by Atkins.    2.5 carbs   Arnold's "Sandwhich Thin"toasted  w/ peanut butter (no jelly: about 20 net carbs  "Bagel Thin" with cream cheese and salmon: about 20 carbs   a scrambled egg/bacon/cheese burrito made with Mission's "carb balance" whole wheat tortilla  (about 10 net carbs )   Avoid cereal and bananas, oatmeal and cream of wheat and grits. They are loaded with carbohydrates!   10 AM: high protein snack  Protein bar by Atkins (the snack size, under 200 cal, usually < 6 net carbs).    A stick of cheese:  Around 1 carb,  100 cal     Dannon Light n Fit [place] Yogurt  (80 cal, 8 carbs)  Other so called "protein bars" and Greek yogurts tend to be loaded with carbohydrates.  Remember, in food advertising, the word "energy" is synonymous for " carbohydrate."  Lunch:   A Sandwich using the bread choices listed, Can use any  Eggs,  lunchmeat, grilled meat or canned tuna), avocado, regular mayo/mustard  and cheese.  A Salad using blue cheese, ranch,  Goddess or  vinagrette,  No croutons or "confetti" and no "candied nuts" but regular nuts OK.   No pretzels or chips.  Pickles and miniature sweet peppers are a good low carb alternative that provide a "crunch"  The bread is the only source of carbohydrate in a sandwich and  can be decreased by trying some of these alternatives to traditional loaf bread  Joseph's makes a pita bread and a flat bread that are 50 cal and 4 net carbs available at [place] and [place].  This can be toasted to use with hummous as well  Toufayan makes a low carb flatbread that's 100 cal and 9 net carbs available at [employer] and [employer]'s makes 2 sizes of  Low carb whole wheat tortilla  (The large one is 210 cal and 6 net carbs) Avoid "Low fat dressings, as well as Barry Brunner and [place] dressings They are loaded with sugar!   3 PM/ Mid day  Snack:  Consider  1 ounce of  almonds, walnuts, pistachios, pecans, peanuts,  Macadamia nuts or a nut medley.  Avoid "granola"; the dried cranberries and raisins are loaded with carbohydrates. Mixed nuts as long as there are no raisins,  cranberries or dried fruit.  Try the prosciutto/mozzarella cheese sticks by Fiorruci  In deli /backery section   High protein      6 PM  Dinner:     Meat/fowl/fish with a green salad, and either broccoli, cauliflower, green beans, spinach, brussel sprouts or  Lima beans. DO NOT BREAD THE PROTEIN!!      There is a low carb pasta by Dreamfield's that is acceptable and tastes great: only 5 digestible carbs/serving.( All grocery stores but BJs carry it )  Try Hurley Cisco Angelo's chicken piccata or chicken or eggplant parm over low carb pasta.(Lowes and BJs)   Marjory Lies Sanchez's "Carnitas" (pulled pork, no sauce,  0 carbs) or his beef pot roast to make a dinner burrito (at [employer]J's)  Pesto over low carb pasta (bj's sells a good quality pesto in the center refrigerated section of the deli   Try satueeing  Cheral Marker with mushroooms  Whole wheat pasta is still full of  digestible carbs and  Not as low in glycemic index as Dreamfield's.   Brown rice is still rice,  So skip the rice and noodles if you eat [place] or [place] (or at least limit to 1/2 cup)  9 PM snack :   Breyer's "low carb" fudgsicle or  ice cream bar (Carb Smart line), or  Weight Watcher's ice cream bar , or another "no sugar added" ice cream;  a serving of fresh berries/cherries with whipped cream   Cheese or DANNON'S LlGHT N FIT GREEK YOGURT  8 ounces of [place] unsweetened almond/cococunut milk    Avoid bananas, pineapple, grapes  and watermelon on a regular basis because they are high in sugar.  THINK OF THEM AS DESSERT  Remember that snack Substitutions should be less than 10 NET carbs per serving and meals < 20 carbs. Remember to subtract fiber grams to get the "net carbs."

## 2014-08-24 NOTE — Progress Notes (Signed)
Patient ID: Gabrielle Nelson, female   DOB: September 25, 1970, 43 y.o.   MRN: [id number]  Patient Active Problem List   Diagnosis Date Noted  . Viral URI with cough 08/25/2014  . School physical exam 11/25/2012  . Routine general medical examination at a health care facility 02/15/2012  . Other and unspecified hyperlipidemia 08/18/2011  . Pap smear for cervical cancer screening 08/17/2011  . Obesity (BMI 30-39.9) 08/17/2011  . Multiple thyroid nodules   . Insomnia 08/16/2011    Subjective:  CC:   Chief Complaint  Patient presents with  . Follow-up    66month follow-up    HPI:   Gabrielle Nelson is a 43 y.o. female who presents for  6 month follow up on chronic medical issues including obesity, insomnia and hyperlipidemia. , Lost her voice  On Saturday with no severe URI symptoms other than mild PND and nonprodutive  Cough. 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  Gaining weight back. Has cut back on sweets and sodas but drinking sweet tea at dinner .  Wt Readings from Last 3 Encounters:  08/24/14 209 lb 12 oz (95.142 kg)  02/19/14 205 lb (92.987 kg)  01/08/14 200 lb (90.719 kg)     Past Medical History  Diagnosis Date  . Multiple thyroid nodules April  2012    No past surgical history on file.     The following portions of the patient's history were reviewed and updated as appropriate: Allergies, current medications, and problem list.    Review of Systems:   Patient denies headache, fevers, malaise, unintentional weight loss, skin rash, eye pain, sinus congestion and sinus pain, sore throat, dysphagia,  hemoptysis , cough, dyspnea, wheezing, chest pain, palpitations, orthopnea, edema, abdominal pain, nausea, melena, diarrhea, constipation, flank pain, dysuria, hematuria, urinary  Frequency, nocturia, numbness, tingling, seizures,  Focal weakness, Loss of consciousness,   Tremor, insomnia, depression, anxiety, and suicidal ideation.     History   Social History  . Marital Status: Single    Spouse Name: N/A    Number of Children: N/A  . Years of Education: N/A   Occupational History  . Not on file.   Social History Main Topics  . Smoking status: Former Smoker    Quit date: 08/15/2005  . Smokeless tobacco: Never Used  . Alcohol Use: No  . Drug Use: No  . Sexual Activity: Not on file   Other Topics Concern  . Not on file   Social History Narrative    Objective:  Filed Vitals:   08/24/14 1003  BP: 112/74  Pulse: 83  Temp: 98.2 F (36.8 C)  Resp: 14     General appearance: alert, cooperative and appears stated age Ears: normal TM's and external ear canals both ears Throat: lips, mucosa, and tongue normal; teeth and gums normal Neck: no adenopathy, no carotid bruit, supple, symmetrical, trachea midline and thyroid not enlarged, symmetric, no tenderness/mass/nodules Back: symmetric, no curvature. ROM normal. No CVA tenderness. Lungs: clear to auscultation bilaterally Heart: regular rate and rhythm, S1, S2 normal, no murmur, click, rub or gallop Abdomen: soft, non-tender; bowel sounds normal; no masses,  no organomegaly Pulses: 2+ and symmetric Skin: Skin color, texture, turgor normal. No rashes or lesions Lymph nodes: Cervical, supraclavicular, and axillary nodes normal.  Assessment and Plan:  Problem List Items Addressed This Visit    Breast cancer screening  She is behind in annual screening,  Mammogram ordered.     Relevant Orders      MM DIGITAL SCREENING BILATERAL   Insomnia    Again discussed need for sleep study given her history of snoring , obesity, daytime fatigue and sleepiness.  She has again deferred.      Multiple thyroid nodules    She never followed up with Dr. 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  Thyroid function is normal.    Lab Results  Component Value Date   TSH 0.67 01/08/2014       Obesity (BMI 30-39.9)     I have addressed  BMI and recommended wt loss of 10% of body weigh over the next 6 months using a low glycemic index diet and regular exercise a minimum of 5 days per week.      Viral URI with cough    URI is most likely viral given the mild HEENT  Symptoms  And normal exam.   I have explained that in viral URIS, an antibiotic will not help the symptoms and will increase the risk of developing diarrhea.,  Continue oral and nasal decongestants, and tylenol 650 mq 8 hrs for aches and pains,       Other Visit Diagnoses    Essential hypertension    -  Primary    Relevant Orders       Basic metabolic panel (Completed)

## 2014-08-24 NOTE — Progress Notes (Signed)
Pre visit review using our clinic review tool, if applicable. No additional management support is needed unless otherwise documented below in the visit note. 

## 2014-08-25 ENCOUNTER — Encounter: Payer: Self-pay | Admitting: Internal Medicine

## 2014-08-25 DIAGNOSIS — B9789 Other viral agents as the cause of diseases classified elsewhere: Secondary | ICD-10-CM

## 2014-08-25 DIAGNOSIS — Z1239 Encounter for other screening for malignant neoplasm of breast: Secondary | ICD-10-CM | POA: Insufficient documentation

## 2014-08-25 DIAGNOSIS — J069 Acute upper respiratory infection, unspecified: Secondary | ICD-10-CM | POA: Insufficient documentation

## 2014-08-25 NOTE — Assessment & Plan Note (Signed)
She never followed up with Dr. 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  Thyroid function is normal.    Lab Results  Component Value Date   TSH 0.67 01/08/2014

## 2014-08-25 NOTE — Assessment & Plan Note (Signed)
URI is most likely viral given the mild HEENT  Symptoms  And normal exam.   I have explained that in viral URIS, an antibiotic will not help the symptoms and will increase the risk of developing diarrhea.,  Continue oral and nasal decongestants, and tylenol 650 mq 8 hrs for aches and pains,

## 2014-08-25 NOTE — Assessment & Plan Note (Signed)
I have addressed  BMI and recommended wt loss of 10% of body weigh over the next 6 months using a low glycemic index diet and regular exercise a minimum of 5 days per week.   

## 2014-08-25 NOTE — Assessment & Plan Note (Signed)
Again discussed need for sleep study given her history of snoring , obesity, daytime fatigue and sleepiness.  She has again deferred.

## 2014-08-25 NOTE — Assessment & Plan Note (Signed)
She is behind in annual screening,  Mammogram ordered.

## 2014-08-26 ENCOUNTER — Telehealth: Payer: Self-pay | Admitting: Internal Medicine

## 2014-08-26 NOTE — Telephone Encounter (Signed)
emmi emailed °

## 2014-12-25 ENCOUNTER — Other Ambulatory Visit: Payer: Self-pay | Admitting: Internal Medicine

## 2015-01-15 ENCOUNTER — Other Ambulatory Visit: Payer: Self-pay | Admitting: Internal Medicine

## 2015-02-08 ENCOUNTER — Other Ambulatory Visit: Payer: Self-pay | Admitting: Internal Medicine

## 2015-02-15 ENCOUNTER — Other Ambulatory Visit: Payer: Self-pay | Admitting: Internal Medicine

## 2015-02-25 ENCOUNTER — Ambulatory Visit (INDEPENDENT_AMBULATORY_CARE_PROVIDER_SITE_OTHER): Payer: 59 | Admitting: Internal Medicine

## 2015-02-25 ENCOUNTER — Encounter: Payer: Self-pay | Admitting: Internal Medicine

## 2015-02-25 VITALS — BP 118/88 | HR 77 | Temp 97.7°F | Resp 14 | Ht 65.25 in | Wt 209.5 lb

## 2015-02-25 DIAGNOSIS — E785 Hyperlipidemia, unspecified: Secondary | ICD-10-CM

## 2015-02-25 DIAGNOSIS — Z0000 Encounter for general adult medical examination without abnormal findings: Secondary | ICD-10-CM

## 2015-02-25 DIAGNOSIS — E669 Obesity, unspecified: Secondary | ICD-10-CM

## 2015-02-25 DIAGNOSIS — E559 Vitamin D deficiency, unspecified: Secondary | ICD-10-CM | POA: Diagnosis not present

## 2015-02-25 DIAGNOSIS — E042 Nontoxic multinodular goiter: Secondary | ICD-10-CM | POA: Diagnosis not present

## 2015-02-25 DIAGNOSIS — Z1159 Encounter for screening for other viral diseases: Secondary | ICD-10-CM

## 2015-02-25 LAB — COMPREHENSIVE METABOLIC PANEL
ALK PHOS: 51 U/L (ref 39–117)
ALT: 13 U/L (ref 0–35)
AST: 15 U/L (ref 0–37)
Albumin: 3.9 g/dL (ref 3.5–5.2)
BILIRUBIN TOTAL: 0.3 mg/dL (ref 0.2–1.2)
BUN: 14 mg/dL (ref 6–23)
CO2: 27 mEq/L (ref 19–32)
CREATININE: 0.79 mg/dL (ref 0.40–1.20)
Calcium: 8.9 mg/dL (ref 8.4–10.5)
Chloride: 106 mEq/L (ref 96–112)
GFR: 83.76 mL/min (ref >60.00)
GLUCOSE: 85 mg/dL (ref 70–99)
Potassium: 4.1 mEq/L (ref 3.5–5.1)
Sodium: 138 mEq/L (ref 135–145)
Total Protein: 6.2 g/dL (ref 6.0–8.3)

## 2015-02-25 LAB — LIPID PANEL
Cholesterol: 220 mg/dL — ABNORMAL HIGH (ref 0–200)
HDL: 42.9 mg/dL (ref >39.00)
LDL Cholesterol: 151 mg/dL — ABNORMAL HIGH (ref 0–99)
NONHDL: 177.1
Total CHOL/HDL Ratio: 5
Triglycerides: 130 mg/dL (ref 0.0–149.0)
VLDL: 26 mg/dL (ref 0.0–40.0)

## 2015-02-25 LAB — TSH: TSH: 1.1 u[IU]/mL (ref 0.35–4.50)

## 2015-02-25 LAB — VITAMIN D 25 HYDROXY (VIT D DEFICIENCY, FRACTURES): VITD: 8.09 ng/mL — ABNORMAL LOW (ref 30.00–100.00)

## 2015-02-25 MED ORDER — MELOXICAM 15 MG PO TABS: 15.0000 mg | ORAL_TABLET | Freq: Every day | ORAL | Status: DC

## 2015-02-25 NOTE — Patient Instructions (Signed)
i WANT YOU TO START WALKING 3 TIMES DAILY  I ALSO RECOMMEND THE MEDI FAST DIET  DR Gerome Sam [facility] RUNS A SEMINAR ON IT OCCASIONALLY  BUT YOU CAN GOOGLE IT   Health Maintenance Adopting a healthy lifestyle and getting preventive care can go a long way to promote health and wellness. Talk with your health care provider about what schedule of regular examinations is right for you. This is a good chance for you to check in with your provider about disease prevention and staying healthy. In between checkups, there are plenty of things you can do on your own. Experts have done a lot of research about which lifestyle changes and preventive measures are most likely to keep you healthy. Ask your health care provider for more information. WEIGHT AND DIET  Eat a healthy diet  Be sure to include plenty of vegetables, fruits, low-fat dairy products, and lean protein.  Do not eat a lot of foods high in solid fats, added sugars, or salt.  Get regular exercise. This is one of the most important things you can do for your health.  Most adults should exercise for at least 150 minutes each week. The exercise should increase your heart rate and make you sweat (moderate-intensity exercise).  Most adults should also do strengthening exercises at least twice a week. This is in addition to the moderate-intensity exercise.  Maintain a healthy weight  Body mass index (BMI) is a measurement that can be used to identify possible weight problems. It estimates body fat based on height and weight. Your health care provider can help determine your BMI and help you achieve or maintain a healthy weight.  For females 45 years of age and older:   A BMI below 18.5 is considered underweight.  A BMI of 18.5 to 24.9 is normal.  A BMI of 25 to 29.9 is considered overweight.  A BMI of 30 and above is considered obese.  Watch levels of cholesterol and blood lipids  You should start having your blood tested  for lipids and cholesterol at 45 years of age, then have this test every 5 years.  You may need to have your cholesterol levels checked more often if:  Your lipid or cholesterol levels are high.  You are older than 45 years of age.  You are at high risk for heart disease.  CANCER SCREENING   Lung Cancer  Lung cancer screening is recommended for adults 34-69 years old who are at high risk for lung cancer because of a history of smoking.  A yearly low-dose CT scan of the lungs is recommended for people who:  Currently smoke.  Have quit within the past 15 years.  Have at least a 30-pack-year history of smoking. A pack year is smoking an average of one pack of cigarettes a day for 1 year.  Yearly screening should continue until it has been 15 years since you quit.  Yearly screening should stop if you develop a health problem that would prevent you from having lung cancer treatment.  Breast Cancer  Practice breast self-awareness. This means understanding how your breasts normally appear and feel.  It also means doing regular breast self-exams. Let your health care provider know about any changes, no matter how small.  If you are in your 20s or 30s, you should have a clinical breast exam (CBE) by a health care provider every 1-3 years as part of a regular health exam.  If you are 40 or older, have  a CBE every year. Also consider having a breast X-ray (mammogram) every year.  If you have a family history of breast cancer, talk to your health care provider about genetic screening.  If you are at high risk for breast cancer, talk to your health care provider about having an MRI and a mammogram every year.  Breast cancer gene (BRCA) assessment is recommended for women who have family members with BRCA-related cancers. BRCA-related cancers include:  Breast.  Ovarian.  Tubal.  Peritoneal cancers.  Results of the assessment will determine the need for genetic counseling and  BRCA1 and BRCA2 testing. Cervical Cancer Routine pelvic examinations to screen for cervical cancer are no longer recommended for nonpregnant women who are considered low risk for cancer of the pelvic organs (ovaries, uterus, and vagina) and who do not have symptoms. A pelvic examination may be necessary if you have symptoms including those associated with pelvic infections. Ask your health care provider if a screening pelvic exam is right for you.   The Pap test is the screening test for cervical cancer for women who are considered at risk.  If you had a hysterectomy for a problem that was not cancer or a condition that could lead to cancer, then you no longer need Pap tests.  If you are older than 65 years, and you have had normal Pap tests for the past 10 years, you no longer need to have Pap tests.  If you have had past treatment for cervical cancer or a condition that could lead to cancer, you need Pap tests and screening for cancer for at least 20 years after your treatment.  If you no longer get a Pap test, assess your risk factors if they change (such as having a new sexual partner). This can affect whether you should start being screened again.  Some women have medical problems that increase their chance of getting cervical cancer. If this is the case for you, your health care provider may recommend more frequent screening and Pap tests.  The human papillomavirus (HPV) test is another test that may be used for cervical cancer screening. The HPV test looks for the virus that can cause cell changes in the cervix. The cells collected during the Pap test can be tested for HPV.  The HPV test can be used to screen women 80 years of age and older. Getting tested for HPV can extend the interval between normal Pap tests from three to five years.  An HPV test also should be used to screen women of any age who have unclear Pap test results.  After 45 years of age, women should have HPV testing as  often as Pap tests.  Colorectal Cancer  This type of cancer can be detected and often prevented.  Routine colorectal cancer screening usually begins at 45 years of age and continues through 44 years of age.  Your health care provider may recommend screening at an earlier age if you have risk factors for colon cancer.  Your health care provider may also recommend using home test kits to check for hidden blood in the stool.  A small camera at the end of a tube can be used to examine your colon directly (sigmoidoscopy or colonoscopy). This is done to check for the earliest forms of colorectal cancer.  Routine screening usually begins at age 20.  Direct examination of the colon should be repeated every 5-10 years through 45 years of age. However, you may need to be screened more  often if early forms of precancerous polyps or small growths are found. Skin Cancer  Check your skin from head to toe regularly.  Tell your health care provider about any new moles or changes in moles, especially if there is a change in a mole's shape or color.  Also tell your health care provider if you have a mole that is larger than the size of a pencil eraser.  Always use sunscreen. Apply sunscreen liberally and repeatedly throughout the day.  Protect yourself by wearing long sleeves, pants, a wide-brimmed hat, and sunglasses whenever you are outside. HEART DISEASE, DIABETES, AND HIGH BLOOD PRESSURE   Have your blood pressure checked at least every 1-2 years. High blood pressure causes heart disease and increases the risk of stroke.  If you are between 49 years and 24 years old, ask your health care provider if you should take aspirin to prevent strokes.  Have regular diabetes screenings. This involves taking a blood sample to check your fasting blood sugar level.  If you are at a normal weight and have a low risk for diabetes, have this test once every three years after 45 years of age.  If you are  overweight and have a high risk for diabetes, consider being tested at a younger age or more often. PREVENTING INFECTION  Hepatitis B  If you have a higher risk for hepatitis B, you should be screened for this virus. You are considered at high risk for hepatitis B if:  You were born in a country where hepatitis B is common. Ask your health care provider which countries are considered high risk.  Your parents were born in a high-risk country, and you have not been immunized against hepatitis B (hepatitis B vaccine).  You have HIV or AIDS.  You use needles to inject street drugs.  You live with someone who has hepatitis B.  You have had sex with someone who has hepatitis B.  You get hemodialysis treatment.  You take certain medicines for conditions, including cancer, organ transplantation, and autoimmune conditions. Hepatitis C  Blood testing is recommended for:  Everyone born from [birth year] through 1966.  Anyone with known risk factors for hepatitis C. Sexually transmitted infections (STIs)  You should be screened for sexually transmitted infections (STIs) including gonorrhea and chlamydia if:  You are sexually active and are younger than 45 years of age.  You are older than 45 years of age and your health care provider tells you that you are at risk for this type of infection.  Your sexual activity has changed since you were last screened and you are at an increased risk for chlamydia or gonorrhea. Ask your health care provider if you are at risk.  If you do not have HIV, but are at risk, it may be recommended that you take a prescription medicine daily to prevent HIV infection. This is called pre-exposure prophylaxis (PrEP). You are considered at risk if:  You are sexually active and do not regularly use condoms or know the HIV status of your partner(s).  You take drugs by injection.  You are sexually active with a partner who has HIV. Talk with your health care provider  about whether you are at high risk of being infected with HIV. If you choose to begin PrEP, you should first be tested for HIV. You should then be tested every 3 months for as long as you are taking PrEP.  PREGNANCY   If you are premenopausal and you may become  pregnant, ask your health care provider about preconception counseling.  If you may become pregnant, take 400 to 800 micrograms (mcg) of folic acid every day.  If you want to prevent pregnancy, talk to your health care provider about birth control (contraception). OSTEOPOROSIS AND MENOPAUSE   Osteoporosis is a disease in which the bones lose minerals and strength with aging. This can result in serious bone fractures. Your risk for osteoporosis can be identified using a bone density scan.  If you are 77 years of age or older, or if you are at risk for osteoporosis and fractures, ask your health care provider if you should be screened.  Ask your health care provider whether you should take a calcium or vitamin D supplement to lower your risk for osteoporosis.  Menopause may have certain physical symptoms and risks.  Hormone replacement therapy may reduce some of these symptoms and risks. Talk to your health care provider about whether hormone replacement therapy is right for you.  HOME CARE INSTRUCTIONS   Schedule regular health, dental, and eye exams.  Stay current with your immunizations.   Do not use any tobacco products including cigarettes, chewing tobacco, or electronic cigarettes.  If you are pregnant, do not drink alcohol.  If you are breastfeeding, limit how much and how often you drink alcohol.  Limit alcohol intake to no more than 1 drink per day for nonpregnant women. One drink equals 12 ounces of beer, 5 ounces of wine, or 1 ounces of hard liquor.  Do not use street drugs.  Do not share needles.  Ask your health care provider for help if you need support or information about quitting drugs.  Tell your  health care provider if you often feel depressed.  Tell your health care provider if you have ever been abused or do not feel safe at home. Document Released: 03/06/2011 Document Revised: 01/05/2014 Document Reviewed: 07/23/2013 [facility] Patient Information 2015 [place], [place]. This information is not intended to replace advice given to you by your health care provider. Make sure you discuss any questions you have with your health care provider.

## 2015-02-25 NOTE — Progress Notes (Signed)
Pre-visit discussion using our clinic review tool. No additional management support is needed unless otherwise documented below in the visit note.  

## 2015-02-25 NOTE — Progress Notes (Addendum)
Patient ID: Gabrielle Nelson, female    DOB: Jan 06, 1971  Age: 44 y.o. MRN: [id number]  The patient is here for annual  wellness examination and management of other chronic and acute problems  GAINED BACk THE 4 LBS SHE LOST  HOT FLASHES STARTED A FEW YEARS AGO BUT RECENTLY OCCURRING NOT DAILY .  PERIODS ARE STILL REGULAR,  TUBAL LIGATION  DIET DISCUSSED, VERY LIMITED PALETTE .  RECOMMENDED MEDI FAST DIET AND DR Gerome Sam.  NOT EXERCISING.  WORKING FULL TIME 29 TO 31   .   The risk factors are reflected in the social history.  The roster of all physicians providing medical care to patient - is listed in the Snapshot section of the chart.  Activities of daily living:  The patient is 100% independent in all ADLs: dressing, toileting, feeding as well as independent mobility  Home safety : The patient has smoke detectors in the home. They wear seatbelts.  There are no firearms at home. There is no violence in the home.   There is no risks for hepatitis, STDs or HIV. There is no   history of blood transfusion. They have no travel history to infectious disease endemic areas of the world.  The patient has seen their dentist in the last six month. They have seen their eye doctor in the last year. They admit to slight hearing difficulty with regard to whispered voices and some television programs.  They have deferred audiologic testing in the last year.  They do not  have excessive sun exposure. Discussed the need for sun protection: hats, long sleeves and use of sunscreen if there is significant sun exposure.   Diet: the importance of a healthy diet is discussed. They do have a healthy diet.  The benefits of regular aerobic exercise were discussed. She walks 4 times per week ,  20 minutes.   Depression screen: there are no signs or vegative symptoms of depression- irritability, change in appetite, anhedonia, sadness/tearfullness.  Cognitive assessment: the patient manages all their financial and  personal affairs and is actively engaged. They could relate day,date,year and events; recalled 2/3 objects at 3 minutes; performed clock-face test normally.  The following portions of the patient's history were reviewed and updated as appropriate: allergies, current medications, past family history, past medical history,  past surgical history, past social history  and problem list.  Visual acuity was not assessed per patient preference since she has regular follow up with her ophthalmologist. Hearing and body mass index were assessed and reviewed.   During the course of the visit the patient was educated and counseled about appropriate screening and preventive services including : fall prevention , diabetes screening, nutrition counseling, colorectal cancer screening, and recommended immunizations.    CC: The primary encounter diagnosis was Multiple thyroid nodules. Diagnoses of Obesity (BMI 30-39.9), Hyperlipidemia, Vitamin D deficiency, Need for hepatitis C screening test, and Visit for preventive health examination were also pertinent to this visit.  History Gabrielle Nelson has a past medical history of Multiple thyroid nodules (April  2012).   She has no past surgical history on file.   Her family history is negative for Cancer.She reports that she quit smoking about 9 years ago. She has never used smokeless tobacco. She reports that she does not drink alcohol or use illicit drugs.  Outpatient Prescriptions Prior to Visit  Medication Sig Dispense Refill  . fish oil-omega-3 fatty acids 1000 MG capsule Take 1,200 mg by mouth 4 (four) times daily.     Marland Kitchen  glucosamine-chondroitin 500-400 MG tablet Take 2 tablets by mouth daily.     Marland Kitchen lisinopril (PRINIVIL,ZESTRIL) 40 MG tablet TAKE ONE TABLET BY MOUTH EVERY DAY 90 tablet 3  . loratadine (CLARITIN) 10 MG tablet TAKE ONE TABLET BY MOUTH EVERY DAY 90 tablet 1  . ranitidine (ZANTAC) 150 MG capsule Take 1 capsule (150 mg total) by mouth daily. 90 capsule 2   . meloxicam (MOBIC) 15 MG tablet TAKE ONE TABLET BY MOUTH EVERY DAY 30 tablet 0  . ALPRAZolam (XANAX) 0.5 MG tablet Take 1 tablet (0.5 mg total) by mouth at bedtime as needed for anxiety or sleep. 30 tablet 0  . naproxen sodium (ANAPROX) 220 MG tablet Take 220 mg by mouth daily.      No facility-administered medications prior to visit.    Review of Systems   Patient denies headache, fevers, malaise, unintentional weight loss, skin rash, eye pain, sinus congestion and sinus pain, sore throat, dysphagia,  hemoptysis , cough, dyspnea, wheezing, chest pain, palpitations, orthopnea, edema, abdominal pain, nausea, melena, diarrhea, constipation, flank pain, dysuria, hematuria, urinary  Frequency, nocturia, numbness, tingling, seizures,  Focal weakness, Loss of consciousness,  Tremor, insomnia, depression, anxiety, and suicidal ideation.      Objective:  BP 118/88 mmHg  Pulse 77  Temp(Src) 97.7 F (36.5 C) (Oral)  Resp 14  Ht 5' 5.25" (1.657 m)  Wt 209 lb 8 oz (95.029 kg)  BMI 34.61 kg/m2  SpO2 99%  LMP 02/12/2015 (Approximate)  Physical Exam   General appearance: alert, cooperative and appears stated age Head: Normocephalic, without obvious abnormality, atraumatic Eyes: conjunctivae/corneas clear. PERRL, EOM's intact. Fundi benign. Ears: normal TM's and external ear canals both ears Nose: Nares normal. Septum midline. Mucosa normal. No drainage or sinus tenderness. Throat: lips, mucosa, and tongue normal; teeth and gums normal Neck: no adenopathy, no carotid bruit, no JVD, supple, symmetrical, trachea midline and thyroid not enlarged, symmetric, no tenderness/mass/nodules Lungs: clear to auscultation bilaterally Breasts: normal appearance, no masses or tenderness Heart: regular rate and rhythm, S1, S2 normal, no murmur, click, rub or gallop Abdomen: soft, non-tender; bowel sounds normal; no masses,  no organomegaly Extremities: extremities normal, atraumatic, no cyanosis or  edema Pulses: 2+ and symmetric Skin: Skin color, texture, turgor normal. No rashes or lesions Neurologic: Alert and oriented X 3, normal strength and tone. Normal symmetric reflexes. Normal coordination and gait.     Assessment & Plan:   Problem List Items Addressed This Visit    Visit for preventive health examination    Annual comprehensive preventive exam was done as well as an evaluation and management of chronic conditions .  During the course of the visit the patient was educated and counseled about appropriate screening and preventive services including :  diabetes screening, lipid analysis with projected  10 year  risk for CAD  , , nutrition counseling, colorectal cancer screening, and recommended immunizations.  Printed recommendations for health maintenance screenings was given.       Vitamin D deficiency   Relevant Orders   Vit D  25 hydroxy (rtn osteoporosis monitoring) (Completed)   Obesity (BMI 30-39.9)    I have addressed  BMI and recommended a low glycemic index diet utilizing smaller more frequent meals to increase metabolism.  I have also recommended that patient start exercising with a goal of 30 minutes of aerobic exercise a minimum of 5 days per week. Screening for lipid disorders, thyroid and diabetes to be done today.  Relevant Orders   Comprehensive metabolic panel (Completed)   Hyperlipidemia   Relevant Orders   Lipid panel (Completed)   Multiple thyroid nodules - Primary   Relevant Orders   TSH (Completed)    Other Visit Diagnoses    Need for hepatitis C screening test        Relevant Orders    Hepatitis C antibody (Completed)       I have discontinued Ms. Cowing's naproxen sodium. I have also changed her meloxicam. Additionally, I am having her maintain her fish oil-omega-3 fatty acids, glucosamine-chondroitin, ALPRAZolam, ranitidine, loratadine, and lisinopril.  Meds ordered this encounter  Medications  . meloxicam (MOBIC) 15 MG tablet     Sig: Take 1 tablet (15 mg total) by mouth daily.    Dispense:  90 tablet    Refill:  1    Medications Discontinued During This Encounter  Medication Reason  . naproxen sodium (ANAPROX) 220 MG tablet Patient Preference  . meloxicam (MOBIC) 15 MG tablet Reorder    Follow-up: Return in about 3 months (around 05/28/2015).   Crecencio Mc, MD

## 2015-02-26 LAB — HEPATITIS C ANTIBODY: HCV Ab: NEGATIVE

## 2015-02-28 DIAGNOSIS — Z0000 Encounter for general adult medical examination without abnormal findings: Secondary | ICD-10-CM | POA: Insufficient documentation

## 2015-02-28 NOTE — Assessment & Plan Note (Signed)
I have addressed  BMI and recommended a low glycemic index diet utilizing smaller more frequent meals to increase metabolism.  I have also recommended that patient start exercising with a goal of 30 minutes of aerobic exercise a minimum of 5 days per week. Screening for lipid disorders, thyroid and diabetes to be done today.   

## 2015-02-28 NOTE — Assessment & Plan Note (Signed)

## 2015-03-01 ENCOUNTER — Other Ambulatory Visit: Payer: Self-pay | Admitting: Internal Medicine

## 2015-03-01 DIAGNOSIS — E559 Vitamin D deficiency, unspecified: Secondary | ICD-10-CM

## 2015-03-01 MED ORDER — ERGOCALCIFEROL 1.25 MG (50000 UT) PO CAPS: 50000.0000 [IU] | ORAL_CAPSULE | ORAL | Status: DC

## 2015-05-06 ENCOUNTER — Other Ambulatory Visit: Payer: Self-pay | Admitting: Internal Medicine

## 2015-05-21 ENCOUNTER — Other Ambulatory Visit: Payer: Self-pay | Admitting: Internal Medicine

## 2015-05-21 NOTE — Telephone Encounter (Signed)
Refused due to end of regimen

## 2015-06-02 ENCOUNTER — Ambulatory Visit (INDEPENDENT_AMBULATORY_CARE_PROVIDER_SITE_OTHER): Payer: BC Managed Care – PPO | Admitting: Internal Medicine

## 2015-06-02 ENCOUNTER — Encounter: Payer: Self-pay | Admitting: Internal Medicine

## 2015-06-02 VITALS — BP 110/80 | HR 68 | Temp 98.2°F | Resp 12 | Ht 65.0 in | Wt 212.0 lb

## 2015-06-02 DIAGNOSIS — E669 Obesity, unspecified: Secondary | ICD-10-CM | POA: Diagnosis not present

## 2015-06-02 DIAGNOSIS — E559 Vitamin D deficiency, unspecified: Secondary | ICD-10-CM | POA: Diagnosis not present

## 2015-06-02 NOTE — Assessment & Plan Note (Signed)
Her BMI continues to increase despite encouragement to follow a low glycemic index diet and start an exercise program. I have reiterated the risks of continued obese state, including diabetes, fatty liver, and sleep apnea. I have asked her to commit to 15 minutes of exercise on a treadmill 5 days a week. Low carbohydrate diet handout given. Return in 3 months.

## 2015-06-02 NOTE — Progress Notes (Signed)
Subjective:  Patient ID: Gabrielle Nelson, female    DOB: [birth year]  Age: 44 y.o. MRN: [id number]  CC: The primary encounter diagnosis was Obesity (BMI 30-39.9). A diagnosis of Vitamin D deficiency was also pertinent to this visit.  HPI Gabrielle Nelson presents for follow up on obesity with weight loss counselling done at last visit .  She did not achieve the 12 lb weight loss goal  Patient has gained 2 lbs .  She is not exercising.  She has changed jobs and now has a two-hour round-trip commute to [place] every day. She does have access to exercise machines, as her son has a weight lifting bench and a treadmill. She is living with her parents right now and her mother cooks dinner. Diet reviewed. She is eating better and banana sandwiches on English muffins as a bedtime snack . She is eating instant  oatmeal and eggs and fruit in the morning..  Lunch is usually a [place] sandwich on white wheat. Dinner is usually a lean protein, usually chicken, with several vegetables. She states she does not eat cake and ice cream on a regular basis .  Outpatient Prescriptions Prior to Visit  Medication Sig Dispense Refill  . ALPRAZolam (XANAX) 0.5 MG tablet Take 1 tablet (0.5 mg total) by mouth at bedtime as needed for anxiety or sleep. 30 tablet 0  . ergocalciferol (DRISDOL) 50000 UNITS capsule Take 1 capsule (50,000 Units total) by mouth once a week. 12 capsule 0  . fish oil-omega-3 fatty acids 1000 MG capsule Take 1,200 mg by mouth 4 (four) times daily.     Marland Kitchen glucosamine-chondroitin 500-400 MG tablet Take 2 tablets by mouth daily.     Marland Kitchen lisinopril (PRINIVIL,ZESTRIL) 40 MG tablet TAKE ONE TABLET BY MOUTH EVERY DAY 90 tablet 3  . loratadine (CLARITIN) 10 MG tablet TAKE ONE TABLET BY MOUTH EVERY DAY 90 tablet 1  . meloxicam (MOBIC) 15 MG tablet Take 1 tablet (15 mg total) by mouth daily. 90 tablet 1  . ranitidine (ZANTAC) 150 MG capsule Take 1 capsule (150 mg total) by mouth daily. 90 capsule 2  .  ranitidine (ZANTAC) 150 MG tablet TAKE ONE TABLET BY MOUTH EVERY DAY 90 tablet 1   No facility-administered medications prior to visit.    Review of Systems;  Patient denies headache, fevers, malaise, unintentional weight loss, skin rash, eye pain, sinus congestion and sinus pain, sore throat, dysphagia,  hemoptysis , cough, dyspnea, wheezing, chest pain, palpitations, orthopnea, edema, abdominal pain, nausea, melena, diarrhea, constipation, flank pain, dysuria, hematuria, urinary  Frequency, nocturia, numbness, tingling, seizures,  Focal weakness, Loss of consciousness,  Tremor, insomnia, depression, anxiety, and suicidal ideation.      Objective:  BP 110/80 mmHg  Pulse 68  Temp(Src) 98.2 F (36.8 C) (Oral)  Resp 12  Ht 5\' 5"  (1.651 m)  Wt 212 lb (96.163 kg)  BMI 35.28 kg/m2  SpO2 96%  BP Readings from Last 3 Encounters:  06/02/15 110/80  02/25/15 118/88  08/24/14 112/74    Wt Readings from Last 3 Encounters:  06/02/15 212 lb (96.163 kg)  02/25/15 209 lb 8 oz (95.029 kg)  08/24/14 209 lb 12 oz (95.142 kg)    General appearance: alert, cooperative and appears stated age Ears: normal TM's and external ear canals both ears Throat: lips, mucosa, and tongue normal; teeth and gums normal Neck: no adenopathy, no carotid bruit, supple, symmetrical, trachea midline and thyroid not enlarged, symmetric, no tenderness/mass/nodules Back: symmetric, no curvature.  ROM normal. No CVA tenderness. Lungs: clear to auscultation bilaterally Heart: regular rate and rhythm, S1, S2 normal, no murmur, click, rub or gallop Abdomen: soft, non-tender; bowel sounds normal; no masses,  no organomegaly Pulses: 2+ and symmetric Skin: Skin color, texture, turgor normal. No rashes or lesions Lymph nodes: Cervical, supraclavicular, and axillary nodes normal.  No results found for: HGBA1C  Lab Results  Component Value Date   CREATININE 0.79 02/25/2015   CREATININE 0.8 08/24/2014   CREATININE 0.8  01/08/2014    Lab Results  Component Value Date   WBC 5.0 01/08/2014   HGB 12.9 01/08/2014   HCT 37.9 01/08/2014   PLT 220.0 01/08/2014   GLUCOSE 85 02/25/2015   CHOL 220* 02/25/2015   TRIG 130.0 02/25/2015   HDL 42.90 02/25/2015   LDLCALC 151* 02/25/2015   ALT 13 02/25/2015   AST 15 02/25/2015   NA 138 02/25/2015   K 4.1 02/25/2015   CL 106 02/25/2015   CREATININE 0.79 02/25/2015   BUN 14 02/25/2015   CO2 27 02/25/2015   TSH 1.10 02/25/2015    No results found.  Assessment & Plan:   Problem List Items Addressed This Visit    Obesity (BMI 30-39.9) - Primary    Her BMI continues to increase despite encouragement to follow a low glycemic index diet and start an exercise program. I have reiterated the risks of continued obese state, including diabetes, fatty liver, and sleep apnea. I have asked her to commit to 15 minutes of exercise on a treadmill 5 days a week. Low carbohydrate diet handout given. Return in 3 months.      Vitamin D deficiency    Her repeat vitamin D level is again low. Supplementation advised.        A total of 25 minutes of face to face time was spent with patient more than half of which was spent in counselling about the above mentioned conditions  and coordination of care   I am having Gabrielle Nelson maintain her fish oil-omega-3 fatty acids, glucosamine-chondroitin, ALPRAZolam, ranitidine, loratadine, lisinopril, meloxicam, ergocalciferol, and ranitidine.  No orders of the defined types were placed in this encounter.    There are no discontinued medications.  Follow-up: Return in about 3 months (around 09/01/2015).   Crecencio Mc, MD

## 2015-06-02 NOTE — Assessment & Plan Note (Signed)
Her repeat vitamin D level is again low. Supplementation advised.

## 2015-06-02 NOTE — Assessment & Plan Note (Signed)
Annual comprehensive preventive exam was done as well as an evaluation and management of chronic conditions .  During the course of the visit the patient was educated and counseled about appropriate screening and preventive services including :  diabetes screening, lipid analysis with projected  10 year  risk for CAD  , , nutrition counseling, colorectal cancer screening, and recommended immunizations.  Printed recommendations for health maintenance screenings was given.   

## 2015-06-02 NOTE — Patient Instructions (Signed)
I would like you to commit to 15 minutes of treadmill 5 days per week. And change your diet as follows:   This is  my version of a  "Low GI"  Weight loss Diet:  It has enabled many of my patients to lose up to 10 lbs per month depending on how much you restrict your carbs.   follow it carefully.  The idea behind low carb diets is that your body has to take the fat and protein in your food and turn it into an energy that is less efficient than carbohydrates, so you lose weight.    I have listed several choices at each of your 6 meal times to make it easy to shop.and plan    Remember that snack Substitutions should be equal to or less than 5 NET carbs per serving and  The only carbs in meals come from the pasta or vegetables so, they should be < 15 net carbs. Remember that carbohydrates from fiber do not count, so you can  subtract fiber grams to get the "net carbs " of any particular food item.    All of the foods can be found at grocery stores and in bulk at [employer].  The Atkins protein bars and shakes are available in more varieties at Target, [employer] and [place].     7 AM Breakfast:  Choose from the following:  Low carbohydrate Protein  Shakes (I recommend the EAS AdvantEdge "Carb Control" shakes, Atkins,  Muscle Milk or Premier Protein shakes  All are < 4  carbs . My favorite is the premier protein chocolate   a scrambled egg/bacon/cheese burrito made with Mission's "carb balance" whole wheat tortilla  (this particular tortilla has only 6 net carbs, once you subtract the fiber grams  )  A slice of home made fritatta (egg based dish without a crust:  google it)    Avoid cereal and bananas, oatmeal and cream of wheat and grits unless you use the [employer] . The others are loaded with carbohydrates and have too few fiber grams   Try the Heritage Flakes cereal available at [facility] on the bottom shelf, with vanilla flavored almond milk  [employer]mart sells an organic almond mik, non  refrigerated,  Called :"orgain"  That is fortified with protein and very low carb  10 AM: high protein snack  Protein bar by Atkins  Or KIND  (the lw sugar variety,  Not all are low , under 200 cal, usually < 6 net carbs).    A stick of cheese:  Around 1 carb,  100 cal      Other so called "protein bars" and Greek yogurts tend to be loaded with carbohydrates.  Remember, in food advertising, the word "energy" is synonymous for " carbohydrate."  Lunch:   A Sandwich using the bread choices listed below,  You  Can use any  Eggs,  lunchmeat, grilled meat or canned tuna), avocado, regular mayo/mustard  and cheese.  A Salad using blue cheese, ranch,  Goddess or vinagrette,  No croutons or "confetti" and no "candied nuts" but regular nuts OK. No "fat free": they add sugar for taste  No pretzels or chips.  Pickles and miniature sweet peppers are a good low carb alternative that provide a "crunch"   Please Note:  The bread is the only source of carbohydrate in a sandwich and  can be decreased by trying some of these alternatives to traditional loaf bread. Kristopher Oppenheim has the  best selection:  Joseph's makes a pita bread and a flat ("Lavash")  bread that are 50 cal and 4 net carbs and are available at [place] and [place].  These can be toasted to make them taste better,  And can be  used as a pita chip to use with hummous as well  Toufayan makes a low carb flatbread that's 100 cal and 9 net carbs available at [employer] and [employer]J's makes 2 sizes of  Low carb whole wheat tortilla  (The large one is 210 cal and 6 net carbs, the small is 120 cal and also 6 net carbs)  Flat Out makes flatbreads that are low carb as well . They're called "Fold Its")   Avoid "Low fat dressings, as well as Barry Brunner and [place] dressings They are loaded with sugar!   3 PM/ Mid day  Snack:  Consider  1 ounce of  almonds, walnuts, pistachios, pecans, peanuts,  Macadamia nuts or a nut medley are ok .  Avoid "granola"; the  dried cranberries and raisins are loaded with carbohydrates. Mixed nuts as long as there are no raisins,  cranberries or dried fruit.    Try the prosciutto/mozzarella cheese sticks by [employer]  In deli /backery section   High protein 80 cal each , 1 carb   To avoid overindulging in snacks: Try drinking a glass of unsweeted almond/coconut milk  Or a cup of coffee with your Atkins chocolate bar to keep you from having 3!!!   Pork rinds!  Yes Pork Rinds  Are on the diet .  They are your potato chip.        6 PM  Dinner:     Meat/fowl/fish with a green salad, with steamed/grilled/sauteed vegggie: broccoli, cauliflower, green beans, spinach, brussel sprouts or  Lima beans. DO NOT BREAD THE PROTEIN!!      There is a low carb pasta by Dreamfield's that is acceptable and tastes great: only 5 digestible carbs/serving.( All grocery stores but BJs carry it )  [place] Shark rice is still rice,  So skip the rice and noodles if you eat [place] or [place] (or at least limit to 1 cooked cup)  9 PM snack :   Breyer's "low carb" fudgsicle or  ice cream bar (Carb Smart line), or  Weight Watcher's ice cream bar , or another "no sugar added" ice cream;  a serving of fresh berries/cherries with whipped cream   Cheese or DANNON[employer]S LlG[employer] GREEK YOGURT or the [employer]os greek yogurt   8 ounces of [place] unsweetened almond/cococunut milk  Cheese and crackers (using WASA crackers,  They are low carb) or peanut butter on low carb crackers or pita bread

## 2015-06-02 NOTE — Addendum Note (Signed)
Addended by: Crecencio Mc on: 06/02/2015 09:30 AM   Modules accepted: Miquel Dunn

## 2015-06-02 NOTE — Progress Notes (Signed)
Pre-visit discussion using our clinic review tool. No additional management support is needed unless otherwise documented below in the visit note.  

## 2015-09-02 ENCOUNTER — Ambulatory Visit (INDEPENDENT_AMBULATORY_CARE_PROVIDER_SITE_OTHER): Payer: BC Managed Care – PPO | Admitting: Internal Medicine

## 2015-09-02 ENCOUNTER — Encounter: Payer: Self-pay | Admitting: Internal Medicine

## 2015-09-02 VITALS — BP 124/72 | HR 79 | Temp 98.0°F | Ht 65.25 in | Wt 210.8 lb

## 2015-09-02 DIAGNOSIS — B354 Tinea corporis: Secondary | ICD-10-CM

## 2015-09-02 DIAGNOSIS — R059 Cough, unspecified: Secondary | ICD-10-CM

## 2015-09-02 DIAGNOSIS — E669 Obesity, unspecified: Secondary | ICD-10-CM

## 2015-09-02 DIAGNOSIS — G4700 Insomnia, unspecified: Secondary | ICD-10-CM

## 2015-09-02 DIAGNOSIS — R05 Cough: Secondary | ICD-10-CM

## 2015-09-02 DIAGNOSIS — Z79899 Other long term (current) drug therapy: Secondary | ICD-10-CM | POA: Diagnosis not present

## 2015-09-02 LAB — COMPREHENSIVE METABOLIC PANEL
ALBUMIN: 4 g/dL (ref 3.5–5.2)
ALK PHOS: 49 U/L (ref 39–117)
ALT: 24 U/L (ref 0–35)
AST: 22 U/L (ref 0–37)
BILIRUBIN TOTAL: 0.3 mg/dL (ref 0.2–1.2)
BUN: 12 mg/dL (ref 6–23)
CO2: 26 mEq/L (ref 19–32)
CREATININE: 0.73 mg/dL (ref 0.40–1.20)
Calcium: 9.2 mg/dL (ref 8.4–10.5)
Chloride: 106 mEq/L (ref 96–112)
GFR: 91.54 mL/min (ref >60.00)
Glucose, Bld: 91 mg/dL (ref 70–99)
POTASSIUM: 4.3 meq/L (ref 3.5–5.1)
SODIUM: 139 meq/L (ref 135–145)
TOTAL PROTEIN: 6.2 g/dL (ref 6.0–8.3)

## 2015-09-02 MED ORDER — TERBINAFINE HCL 250 MG PO TABS: 250.0000 mg | ORAL_TABLET | Freq: Every day | ORAL | Status: DC

## 2015-09-02 NOTE — Patient Instructions (Addendum)
I am treating you for ringworm with daily oral terbinafine.  Take it for one month,  And we can refill if it has improved but not resolved.  Your cough may be freom reflux,  Resolving bronchitis or from the lisinopril  Increase the ranitidine to twice daily. If cough is still present when your next lisinopril refill is due,  We should change your medication         Body Ringworm Ringworm (tinea corporis) is a fungal infection of the skin on the body. This infection is not caused by worms, but is actually caused by a fungus. Fungus normally lives on the top of your skin and can be useful. However, in the case of ringworms, the fungus grows out of control and causes a skin infection. It can involve any area of skin on the body and can spread easily from one person to another (contagious). Ringworm is a common problem for children, but it can affect adults as well. Ringworm is also often found in athletes, especially wrestlers who share equipment and mats.  CAUSES  Ringworm of the body is caused by a fungus called dermatophyte. It can spread by:  Touchingother people who are infected.  Touchinginfected pets.  Touching or sharingobjects that have been in contact with the infected person or pet (hats, combs, towels, clothing, sports equipment). SYMPTOMS   Itchy, raised red spots and bumps on the skin.  Ring-shaped rash.  Redness near the border of the rash with a clear center.  Dry and scaly skin on or around the rash. Not every person develops a ring-shaped rash. Some develop only the red, scaly patches. DIAGNOSIS  Most often, ringworm can be diagnosed by performing a skin exam. Your caregiver may choose to take a skin scraping from the affected area. The sample will be examined under the microscope to see if the fungus is present.  TREATMENT  Body ringworm may be treated with a topical antifungal cream or ointment. Sometimes, an antifungal shampoo that can be used on your body is  prescribed. You may be prescribed antifungal medicines to take by mouth if your ringworm is severe, keeps coming back, or lasts a long time.  HOME CARE INSTRUCTIONS   Only take over-the-counter or prescription medicines as directed by your caregiver.  Wash the infected area and dry it completely before applying yourcream or ointment.  When using antifungal shampoo to treat the ringworm, leave the shampoo on the body for 3-5 minutes before rinsing.   Wear loose clothing to stop clothes from rubbing and irritating the rash.  Wash or change your bed sheets every night while you have the rash.  Have your pet treated by your veterinarian if it has the same infection. To prevent ringworm:   Practice good hygiene.  Wear sandals or shoes in public places and showers.  Do not share personal items with others.  Avoid touching red patches of skin on other people.  Avoid touching pets that have bald spots or wash your hands after doing so. SEEK MEDICAL CARE IF:   Your rash continues to spread after 7 days of treatment.  Your rash is not gone in 4 weeks.  The area around your rash becomes red, warm, tender, and swollen.   This information is not intended to replace advice given to you by your health care provider. Make sure you discuss any questions you have with your health care provider.   Document Released: 08/18/2000 Document Revised: 05/15/2012 Document Reviewed: 03/04/2012 Elsevier Interactive Patient  Education 2016 [employer].

## 2015-09-02 NOTE — Progress Notes (Signed)
Subjective:  Patient ID: Gabrielle Nelson, female    DOB: 04-28-1971  Age: 44 y.o. MRN: [id number]  CC: The primary encounter diagnosis was Ringworm of body. Diagnoses of Long-term use of high-risk medication, Cough, Obesity (BMI 30-39.9), and Insomnia were also pertinent to this visit.  HPI Gabrielle Nelson presents for FOLLOW UP  On chronic issues and several acute issues  1_) has developed an annular nonitchy macular lesions on both legs. No recent medication or soap changes .  Some contact with preschoolers. No swimming or hot tubbing or sauna. Has not tried anything on the rash yet.    2) Having a lingering cough following a URI in November .  Nonproductive.  No relation to position.  No dyspnea, has GERC treated with ranitidine.  Takes lisinopril for BP.  Outpatient Prescriptions Prior to Visit  Medication Sig Dispense Refill  . ALPRAZolam (XANAX) 0.5 MG tablet Take 1 tablet (0.5 mg total) by mouth at bedtime as needed for anxiety or sleep. 30 tablet 0  . ergocalciferol (DRISDOL) 50000 UNITS capsule Take 1 capsule (50,000 Units total) by mouth once a week. (Patient taking differently: Take 2,000 Units by mouth once a week. ) 12 capsule 0  . fish oil-omega-3 fatty acids 1000 MG capsule Take 1,200 mg by mouth 4 (four) times daily.     Marland Kitchen glucosamine-chondroitin 500-400 MG tablet Take 2 tablets by mouth daily.     Marland Kitchen lisinopril (PRINIVIL,ZESTRIL) 40 MG tablet TAKE ONE TABLET BY MOUTH EVERY DAY 90 tablet 3  . loratadine (CLARITIN) 10 MG tablet TAKE ONE TABLET BY MOUTH EVERY DAY 90 tablet 1  . meloxicam (MOBIC) 15 MG tablet Take 1 tablet (15 mg total) by mouth daily. 90 tablet 1  . ranitidine (ZANTAC) 150 MG tablet TAKE ONE TABLET BY MOUTH EVERY DAY 90 tablet 1  . ranitidine (ZANTAC) 150 MG capsule Take 1 capsule (150 mg total) by mouth daily. (Patient not taking: Reported on 09/02/2015) 90 capsule 2   No facility-administered medications prior to visit.    Review of Systems;  Patient  denies headache, fevers, malaise, unintentional weight loss, skin rash, eye pain, sinus congestion and sinus pain, sore throat, dysphagia,  hemoptysis , cough, dyspnea, wheezing, chest pain, palpitations, orthopnea, edema, abdominal pain, nausea, melena, diarrhea, constipation, flank pain, dysuria, hematuria, urinary  Frequency, nocturia, numbness, tingling, seizures,  Focal weakness, Loss of consciousness,  Tremor, insomnia, depression, anxiety, and suicidal ideation.      Objective:  BP 124/72 mmHg  Pulse 79  Temp(Src) 98 F (36.7 C) (Oral)  Ht 5' 5.25" (1.657 m)  Wt 210 lb 12 oz (95.596 kg)  BMI 34.82 kg/m2  SpO2 97%  BP Readings from Last 3 Encounters:  09/02/15 124/72  06/02/15 110/80  02/25/15 118/88    Wt Readings from Last 3 Encounters:  09/02/15 210 lb 12 oz (95.596 kg)  06/02/15 212 lb (96.163 kg)  02/25/15 209 lb 8 oz (95.029 kg)    General appearance: alert, cooperative and appears stated age Throat: lips, mucosa, and tongue normal; teeth and gums normal Neck: no adenopathy, no carotid bruit, supple, symmetrical, trachea midline and thyroid not enlarged, symmetric, no tenderness/mass/nodules Back: symmetric, no curvature. ROM normal. No CVA tenderness. Lungs: clear to auscultation bilaterally Heart: regular rate and rhythm, S1, S2 normal, no murmur, click, rub or gallop Abdomen: soft, non-tender; bowel sounds normal; no ,masses,  no organomegaly Pulses: 2+ and symmetric Skin: multiple annular lesions with defined borders on both legs.   Lymph  nodes: Cervical, supraclavicular, and axillary nodes normal.  No results found for: HGBA1C  Lab Results  Component Value Date   CREATININE 0.73 09/02/2015   CREATININE 0.79 02/25/2015   CREATININE 0.8 08/24/2014    Lab Results  Component Value Date   WBC 5.0 01/08/2014   HGB 12.9 01/08/2014   HCT 37.9 01/08/2014   PLT 220.0 01/08/2014   GLUCOSE 91 09/02/2015   CHOL 220* 02/25/2015   TRIG 130.0 02/25/2015   HDL  42.90 02/25/2015   LDLCALC 151* 02/25/2015   ALT 24 09/02/2015   AST 22 09/02/2015   NA 139 09/02/2015   K 4.3 09/02/2015   CL 106 09/02/2015   CREATININE 0.73 09/02/2015   BUN 12 09/02/2015   CO2 26 09/02/2015   TSH 1.10 02/25/2015    No results found.  Assessment & Plan:   Problem List Items Addressed This Visit    Insomnia    She never followed up with Dr. 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  Thyroid function is normal.    Lab Results  Component Value Date   TSH 1.10 02/25/2015           Obesity (BMI 30-39.9)    I have addressed  BMI and recommended a low glycemic index diet utilizing smaller more frequent meals to increase metabolism.  I have also recommended that patient start exercising with a goal of 30 minutes of aerobic exercise a minimum of 5 days per week.       Ringworm of body - Primary    Suggested by exam. Will prescribe terbinafine  For one month,  Liver enzymes normal today.   Lab Results  Component Value Date   ALT 24 09/02/2015   AST 22 09/02/2015   ALKPHOS 49 09/02/2015   BILITOT 0.3 09/02/2015         Relevant Medications   terbinafine (LAMISIL) 250 MG tablet   Cough    Post viral vs GERD vs lisinopril.  Advised to increase ranitidine to bid. , continue claritin.        Other Visit Diagnoses    Long-term use of high-risk medication        Relevant Orders    Comprehensive metabolic panel (Completed)       I am having Gabrielle Nelson start on terbinafine. I am also having her maintain her fish oil-omega-3 fatty acids, glucosamine-chondroitin, ALPRAZolam, ranitidine, loratadine, lisinopril, meloxicam, ergocalciferol, and ranitidine.  Meds ordered this encounter  Medications  . terbinafine (LAMISIL) 250 MG tablet    Sig: Take 1 tablet (250 mg total) by mouth daily.    Dispense:  30 tablet    Refill:  1    There are no discontinued medications.  Follow-up: Return in about 4 weeks (around 09/30/2015) for rash .   Crecencio Mc, MD

## 2015-09-04 DIAGNOSIS — R05 Cough: Secondary | ICD-10-CM | POA: Insufficient documentation

## 2015-09-04 DIAGNOSIS — R059 Cough, unspecified: Secondary | ICD-10-CM | POA: Insufficient documentation

## 2015-09-04 DIAGNOSIS — B354 Tinea corporis: Secondary | ICD-10-CM | POA: Insufficient documentation

## 2015-09-04 NOTE — Assessment & Plan Note (Signed)
I have addressed  BMI and recommended a low glycemic index diet utilizing smaller more frequent meals to increase metabolism.  I have also recommended that patient start exercising with a goal of 30 minutes of aerobic exercise a minimum of 5 days per week.  

## 2015-09-04 NOTE — Assessment & Plan Note (Signed)
Post viral vs GERD vs lisinopril.  Advised to increase ranitidine to bid. , continue claritin.

## 2015-09-04 NOTE — Assessment & Plan Note (Signed)
She never followed up with Dr. 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  Thyroid function is normal.    Lab Results  Component Value Date   TSH 1.10 02/25/2015

## 2015-09-04 NOTE — Assessment & Plan Note (Signed)
Suggested by exam. Will prescribe terbinafine  For one month,  Liver enzymes normal today.   Lab Results  Component Value Date   ALT 24 09/02/2015   AST 22 09/02/2015   ALKPHOS 49 09/02/2015   BILITOT 0.3 09/02/2015

## 2015-09-07 ENCOUNTER — Encounter: Payer: Self-pay | Admitting: *Deleted

## 2015-09-19 ENCOUNTER — Other Ambulatory Visit: Payer: Self-pay | Admitting: Internal Medicine

## 2015-10-04 ENCOUNTER — Ambulatory Visit (INDEPENDENT_AMBULATORY_CARE_PROVIDER_SITE_OTHER): Payer: BC Managed Care – PPO | Admitting: Internal Medicine

## 2015-10-04 ENCOUNTER — Encounter: Payer: Self-pay | Admitting: Internal Medicine

## 2015-10-04 VITALS — BP 112/80 | HR 77 | Temp 97.6°F | Resp 12 | Ht 65.25 in | Wt 207.5 lb

## 2015-10-04 DIAGNOSIS — E042 Nontoxic multinodular goiter: Secondary | ICD-10-CM

## 2015-10-04 DIAGNOSIS — E559 Vitamin D deficiency, unspecified: Secondary | ICD-10-CM | POA: Diagnosis not present

## 2015-10-04 DIAGNOSIS — B354 Tinea corporis: Secondary | ICD-10-CM | POA: Diagnosis not present

## 2015-10-04 DIAGNOSIS — Z79899 Other long term (current) drug therapy: Secondary | ICD-10-CM | POA: Diagnosis not present

## 2015-10-04 DIAGNOSIS — E669 Obesity, unspecified: Secondary | ICD-10-CM

## 2015-10-04 DIAGNOSIS — E785 Hyperlipidemia, unspecified: Secondary | ICD-10-CM | POA: Diagnosis not present

## 2015-10-04 LAB — LIPID PANEL
CHOLESTEROL: 196 mg/dL (ref 0–200)
HDL: 45.6 mg/dL (ref >39.00)
LDL Cholesterol: 134 mg/dL — ABNORMAL HIGH (ref 0–99)
NONHDL: 150.64
TRIGLYCERIDES: 85 mg/dL (ref 0.0–149.0)
Total CHOL/HDL Ratio: 4
VLDL: 17 mg/dL (ref 0.0–40.0)

## 2015-10-04 LAB — COMPREHENSIVE METABOLIC PANEL
ALT: 12 U/L (ref 0–35)
AST: 15 U/L (ref 0–37)
Albumin: 4.1 g/dL (ref 3.5–5.2)
Alkaline Phosphatase: 43 U/L (ref 39–117)
BUN: 14 mg/dL (ref 6–23)
CO2: 26 mEq/L (ref 19–32)
Calcium: 9.3 mg/dL (ref 8.4–10.5)
Chloride: 105 mEq/L (ref 96–112)
Creatinine, Ser: 0.85 mg/dL (ref 0.40–1.20)
GFR: 76.76 mL/min (ref >60.00)
Glucose, Bld: 104 mg/dL — ABNORMAL HIGH (ref 70–99)
Potassium: 4.3 mEq/L (ref 3.5–5.1)
Sodium: 138 mEq/L (ref 135–145)
Total Bilirubin: 0.2 mg/dL (ref 0.2–1.2)
Total Protein: 6.7 g/dL (ref 6.0–8.3)

## 2015-10-04 LAB — VITAMIN D 25 HYDROXY (VIT D DEFICIENCY, FRACTURES): VITD: 30.09 ng/mL (ref 30.00–100.00)

## 2015-10-04 NOTE — Assessment & Plan Note (Signed)
I have congratulated her in the loss of a few lbs,  Reviewed her BMI and encouraged  Continued weight loss with goal of 10% of body weigh over the next 6 months using a low glycemic index diet and regular exercise a minimum of 5 days per week.

## 2015-10-04 NOTE — Progress Notes (Signed)
Subjective:  Patient ID: Gabrielle Nelson, female    DOB: 07/29/1971  Age: 45 y.o. MRN: [id number]  CC: The primary encounter diagnosis was Long-term use of high-risk medication. Diagnoses of Vitamin D deficiency, Hyperlipidemia, Ringworm of body, Obesity (BMI 30-39.9), and Multiple thyroid nodules were also pertinent to this visit.  HPI Gabrielle Nelson presents for follow up on recent treatment for tinea corporis and persistent cough .  She has been taking oral terbinafine x 1 month without side effects.  The previously noted lesions have resolved almost completely with no new lesions appearing.  She has refilled the medication recently for a second month.   Cough:  Her previously reported cough has resolved without increase in Ranitidine to bid and no other medication changes.   Obesity:   She has lost  Several lbs since last visit by portion size reduction  Still having starches almost  daily and not exercising regularly due to daily commute to [facility]. Working at [employer].  Schedule discussed ,    Taking 2000 ius d3 daily for level  of 8 in June        Outpatient Prescriptions Prior to Visit  Medication Sig Dispense Refill  . ALPRAZolam (XANAX) 0.5 MG tablet Take 1 tablet (0.5 mg total) by mouth at bedtime as needed for anxiety or sleep. 30 tablet 0  . fish oil-omega-3 fatty acids 1000 MG capsule Take 1,200 mg by mouth 4 (four) times daily.     Marland Kitchen glucosamine-chondroitin 500-400 MG tablet Take 2 tablets by mouth daily.     Marland Kitchen lisinopril (PRINIVIL,ZESTRIL) 40 MG tablet TAKE ONE TABLET BY MOUTH EVERY DAY 90 tablet 3  . loratadine (CLARITIN) 10 MG tablet TAKE ONE TABLET BY MOUTH EVERY DAY 90 tablet 1  . meloxicam (MOBIC) 15 MG tablet TAKE 1 TABLET BY MOUTH EVERY DAY 90 tablet 0  . ranitidine (ZANTAC) 150 MG tablet TAKE ONE TABLET BY MOUTH EVERY DAY 90 tablet 1  . terbinafine (LAMISIL) 250 MG tablet Take 1 tablet (250 mg total) by mouth daily. 30 tablet 1  . ranitidine (ZANTAC) 150 MG  capsule Take 1 capsule (150 mg total) by mouth daily. 90 capsule 2  . ergocalciferol (DRISDOL) 50000 UNITS capsule Take 1 capsule (50,000 Units total) by mouth once a week. (Patient not taking: Reported on 10/04/2015) 12 capsule 0   No facility-administered medications prior to visit.    Review of Systems;  Patient denies headache, fevers, malaise, unintentional weight loss, skin rash, eye pain, sinus congestion and sinus pain, sore throat, dysphagia,  hemoptysis , cough, dyspnea, wheezing, chest pain, palpitations, orthopnea, edema, abdominal pain, nausea, melena, diarrhea, constipation, flank pain, dysuria, hematuria, urinary  Frequency, nocturia, numbness, tingling, seizures,  Focal weakness, Loss of consciousness,  Tremor, insomnia, depression, anxiety, and suicidal ideation.      Objective:  BP 112/80 mmHg  Pulse 77  Temp(Src) 97.6 F (36.4 C) (Oral)  Resp 12  Ht 5' 5.25" (1.657 m)  Wt 207 lb 8 oz (94.121 kg)  BMI 34.28 kg/m2  SpO2 99%  LMP 09/10/2015 (Approximate)  BP Readings from Last 3 Encounters:  10/04/15 112/80  09/02/15 124/72  06/02/15 110/80    Wt Readings from Last 3 Encounters:  10/04/15 207 lb 8 oz (94.121 kg)  09/02/15 210 lb 12 oz (95.596 kg)  06/02/15 212 lb (96.163 kg)    General appearance: alert, cooperative and appears stated age Lungs: clear to auscultation bilaterally Heart: regular rate and rhythm, S1, S2 normal, no  murmur, click, rub or gallop Abdomen: soft, non-tender; bowel sounds normal; no masses,  no organomegaly Pulses: 2+ and symmetric Skin: Skin color, texture, turgor normal. Lesions resolved, some hyperpigmentation residual.  Lymph nodes: Cervical, supraclavicular, and axillary nodes normal.  No results found for: HGBA1C  Lab Results  Component Value Date   CREATININE 0.85 10/04/2015   CREATININE 0.73 09/02/2015   CREATININE 0.79 02/25/2015    Lab Results  Component Value Date   WBC 5.0 01/08/2014   HGB 12.9 01/08/2014    HCT 37.9 01/08/2014   PLT 220.0 01/08/2014   GLUCOSE 104* 10/04/2015   CHOL 196 10/04/2015   TRIG 85.0 10/04/2015   HDL 45.60 10/04/2015   LDLCALC 134* 10/04/2015   ALT 12 10/04/2015   AST 15 10/04/2015   NA 138 10/04/2015   K 4.3 10/04/2015   CL 105 10/04/2015   CREATININE 0.85 10/04/2015   BUN 14 10/04/2015   CO2 26 10/04/2015   TSH 1.10 02/25/2015    No results found.  Assessment & Plan:   Problem List Items Addressed This Visit    Multiple thyroid nodules    She never followed up with Gabrielle Nelson for presumed MNG.  Thyroid function is normal.    Lab Results  Component Value Date   TSH 1.10 02/25/2015           Obesity (BMI 30-39.9)    I have congratulated her in the loss of a few lbs,  Reviewed her BMI and encouraged  Continued weight loss with goal of 10% of body weigh over the next 6 months using a low glycemic index diet and regular exercise a minimum of 5 days per week.        Vitamin D deficiency    Severe, level was 8 in June 2016.  Treated initially with weekly megadose x 3 months,  Now taking 2,000 IUs daily.  Level pending       Relevant Orders   VITAMIN D 25 Hydroxy (Vit-D Deficiency, Fractures) (Completed)   Ringworm of body    Nearly resolved after one month of lamasil.  LFTS normal today.  Finish current rx and advised to use Gold bond medicated powder ointment to prevent recurrence.   Lab Results  Component Value Date   ALT 12 10/04/2015   AST 15 10/04/2015   ALKPHOS 43 10/04/2015   BILITOT 0.2 10/04/2015          Hyperlipidemia   Relevant Orders   Lipid panel (Completed)    Other Visit Diagnoses    Long-term use of high-risk medication    -  Primary    Relevant Orders    Comprehensive metabolic panel (Completed)      A total of 40 minutes of face to face time was spent with patient more than half of which was spent in counselling and coordination of care   I have discontinued Gabrielle Nelson's ergocalciferol. I am also having her  maintain her fish oil-omega-3 fatty acids, glucosamine-chondroitin, ALPRAZolam, loratadine, lisinopril, ranitidine, terbinafine, meloxicam, and Vitamin D3.  Meds ordered this encounter  Medications  . Cholecalciferol (VITAMIN D3) 1000 units CAPS    Sig: Take 2,000 Units by mouth daily.    Medications Discontinued During This Encounter  Medication Reason  . ergocalciferol (DRISDOL) 50000 UNITS capsule Completed Course  . ranitidine (ZANTAC) [id number] MG capsule Duplicate    Follow-up: Return in about 6 months (around 04/02/2016).   Crecencio Mc, MD

## 2015-10-04 NOTE — Assessment & Plan Note (Signed)
Nearly resolved after one month of lamasil.  LFTS normal today.  Finish current rx and advised to use Gold bond medicated powder ointment to prevent recurrence.   Lab Results  Component Value Date   ALT 12 10/04/2015   AST 15 10/04/2015   ALKPHOS 43 10/04/2015   BILITOT 0.2 10/04/2015

## 2015-10-04 NOTE — Assessment & Plan Note (Signed)
She never followed up with Dr. 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  Thyroid function is normal.    Lab Results  Component Value Date   TSH 1.10 02/25/2015

## 2015-10-04 NOTE — Patient Instructions (Signed)
Finish your current month of terbinafine, then stop  Return in July for your annual without PAP

## 2015-10-04 NOTE — Assessment & Plan Note (Addendum)
Severe, level was 8 in June 2016.  Treated initially with weekly megadose x 3 months,  Now taking 2,000 IUs daily.  Level pending

## 2015-10-04 NOTE — Progress Notes (Signed)
Pre-visit discussion using our clinic review tool. No additional management support is needed unless otherwise documented below in the visit note.  

## 2015-10-06 ENCOUNTER — Encounter: Payer: Self-pay | Admitting: *Deleted

## 2015-10-08 ENCOUNTER — Other Ambulatory Visit: Payer: Self-pay | Admitting: Internal Medicine

## 2015-12-18 ENCOUNTER — Other Ambulatory Visit: Payer: Self-pay | Admitting: Internal Medicine

## 2016-01-09 ENCOUNTER — Other Ambulatory Visit: Payer: Self-pay | Admitting: Internal Medicine

## 2016-01-10 ENCOUNTER — Other Ambulatory Visit: Payer: Self-pay | Admitting: Internal Medicine

## 2016-03-31 ENCOUNTER — Encounter: Payer: BC Managed Care – PPO | Admitting: Internal Medicine

## 2016-04-05 ENCOUNTER — Ambulatory Visit (INDEPENDENT_AMBULATORY_CARE_PROVIDER_SITE_OTHER): Payer: BC Managed Care – PPO | Admitting: Internal Medicine

## 2016-04-05 ENCOUNTER — Encounter: Payer: Self-pay | Admitting: Internal Medicine

## 2016-04-05 VITALS — BP 116/80 | HR 84 | Temp 97.7°F | Ht 64.5 in | Wt 206.5 lb

## 2016-04-05 DIAGNOSIS — R7303 Prediabetes: Secondary | ICD-10-CM | POA: Diagnosis not present

## 2016-04-05 DIAGNOSIS — E669 Obesity, unspecified: Secondary | ICD-10-CM

## 2016-04-05 DIAGNOSIS — E785 Hyperlipidemia, unspecified: Secondary | ICD-10-CM

## 2016-04-05 DIAGNOSIS — I10 Essential (primary) hypertension: Secondary | ICD-10-CM

## 2016-04-05 DIAGNOSIS — Z0000 Encounter for general adult medical examination without abnormal findings: Secondary | ICD-10-CM | POA: Diagnosis not present

## 2016-04-05 DIAGNOSIS — Z1239 Encounter for other screening for malignant neoplasm of breast: Secondary | ICD-10-CM

## 2016-04-05 LAB — COMPREHENSIVE METABOLIC PANEL
ALT: 13 U/L (ref 0–35)
AST: 14 U/L (ref 0–37)
Albumin: 4.1 g/dL (ref 3.5–5.2)
Alkaline Phosphatase: 43 U/L (ref 39–117)
BILIRUBIN TOTAL: 0.3 mg/dL (ref 0.2–1.2)
BUN: 13 mg/dL (ref 6–23)
CALCIUM: 9.1 mg/dL (ref 8.4–10.5)
CHLORIDE: 107 meq/L (ref 96–112)
CO2: 25 meq/L (ref 19–32)
CREATININE: 0.78 mg/dL (ref 0.40–1.20)
GFR: 84.57 mL/min (ref >60.00)
Glucose, Bld: 93 mg/dL (ref 70–99)
Potassium: 4.2 mEq/L (ref 3.5–5.1)
Sodium: 140 mEq/L (ref 135–145)
Total Protein: 6.5 g/dL (ref 6.0–8.3)

## 2016-04-05 LAB — LIPID PANEL
CHOL/HDL RATIO: 5
Cholesterol: 216 mg/dL — ABNORMAL HIGH (ref 0–200)
HDL: 47.1 mg/dL (ref >39.00)
LDL CALC: 143 mg/dL — AB (ref 0–99)
NonHDL: 169.33
TRIGLYCERIDES: 133 mg/dL (ref 0.0–149.0)
VLDL: 26.6 mg/dL (ref 0.0–40.0)

## 2016-04-05 LAB — HEMOGLOBIN A1C: Hgb A1c MFr Bld: 5.3 % (ref 4.6–6.5)

## 2016-04-05 MED ORDER — RANITIDINE HCL 150 MG PO TABS: 150.0000 mg | ORAL_TABLET | Freq: Every day | ORAL | 3 refills | Status: DC

## 2016-04-05 MED ORDER — MELOXICAM 15 MG PO TABS: 15.0000 mg | ORAL_TABLET | Freq: Every day | ORAL | 1 refills | Status: DC

## 2016-04-05 NOTE — Patient Instructions (Addendum)
You are at risk for diabetes because of your family history and your current lifestyle.  You need to start doing some form of exercise at least 3 times per week    You need to lose only 6 lb,  To get below 200 lbs   Breakfast bars should be KIND low glycemic index (5 g sugar) or Atkins bars pr [facility]  You might also try a premixed protein drink called Premier Protein shake for breakfast or late night snack . It is great tasting,   very low sugar and available of < $2 serving at [facility] and  In bulk for $1.50/serving at [employer] and [employer]  .    Nutritional analysis :  160 cal  30 g protein  1 g sugar 50% calcium needs   [employer] and [employer]  Lose the french fries and the bun if you have hamburgers on a frequent basis!!  Health Maintenance, Female Adopting a healthy lifestyle and getting preventive care can go a long way to promote health and wellness. Talk with your health care provider about what schedule of regular examinations is right for you. This is a good chance for you to check in with your provider about disease prevention and staying healthy. In between checkups, there are plenty of things you can do on your own. Experts have done a lot of research about which lifestyle changes and preventive measures are most likely to keep you healthy. Ask your health care provider for more information. WEIGHT AND DIET  Eat a healthy diet  Be sure to include plenty of vegetables, fruits, low-fat dairy products, and lean protein.  Do not eat a lot of foods high in solid fats, added sugars, or salt.  Get regular exercise. This is one of the most important things you can do for your health.  Most adults should exercise for at least 150 minutes each week. The exercise should increase your heart rate and make you sweat (moderate-intensity exercise).  Most adults should also do strengthening exercises at least twice a week. This is in addition to the moderate-intensity exercise.  Maintain a healthy  weight  Body mass index (BMI) is a measurement that can be used to identify possible weight problems. It estimates body fat based on height and weight. Your health care provider can help determine your BMI and help you achieve or maintain a healthy weight.  For females 46 years of age and older:   A BMI below 18.5 is considered underweight.  A BMI of 18.5 to 24.9 is normal.  A BMI of 25 to 29.9 is considered overweight.  A BMI of 30 and above is considered obese.  Watch levels of cholesterol and blood lipids  You should start having your blood tested for lipids and cholesterol at 46 years of age, then have this test every 5 years.  You may need to have your cholesterol levels checked more often if:  Your lipid or cholesterol levels are high.  You are older than 46 years of age.  You are at high risk for heart disease.  CANCER SCREENING   Lung Cancer  Lung cancer screening is recommended for adults 23-74 years old who are at high risk for lung cancer because of a history of smoking.  A yearly low-dose CT scan of the lungs is recommended for people who:  Currently smoke.  Have quit within the past 15 years.  Have at least a 30-pack-year history of smoking. A pack year is smoking  an average of one pack of cigarettes a day for 1 year.  Yearly screening should continue until it has been 15 years since you quit.  Yearly screening should stop if you develop a health problem that would prevent you from having lung cancer treatment.  Breast Cancer  Practice breast self-awareness. This means understanding how your breasts normally appear and feel.  It also means doing regular breast self-exams. Let your health care provider know about any changes, no matter how small.  If you are in your 20s or 30s, you should have a clinical breast exam (CBE) by a health care provider every 1-3 years as part of a regular health exam.  If you are 79 or older, have a CBE every year. Also  consider having a breast X-ray (mammogram) every year.  If you have a family history of breast cancer, talk to your health care provider about genetic screening.  If you are at high risk for breast cancer, talk to your health care provider about having an MRI and a mammogram every year.  Breast cancer gene (BRCA) assessment is recommended for women who have family members with BRCA-related cancers. BRCA-related cancers include:  Breast.  Ovarian.  Tubal.  Peritoneal cancers.  Results of the assessment will determine the need for genetic counseling and BRCA1 and BRCA2 testing. Cervical Cancer Your health care provider may recommend that you be screened regularly for cancer of the pelvic organs (ovaries, uterus, and vagina). This screening involves a pelvic examination, including checking for microscopic changes to the surface of your cervix (Pap test). You may be encouraged to have this screening done every 3 years, beginning at age 18.  For women ages 61-65, health care providers may recommend pelvic exams and Pap testing every 3 years, or they may recommend the Pap and pelvic exam, combined with testing for human papilloma virus (HPV), every 5 years. Some types of HPV increase your risk of cervical cancer. Testing for HPV may also be done on women of any age with unclear Pap test results.  Other health care providers may not recommend any screening for nonpregnant women who are considered low risk for pelvic cancer and who do not have symptoms. Ask your health care provider if a screening pelvic exam is right for you.  If you have had past treatment for cervical cancer or a condition that could lead to cancer, you need Pap tests and screening for cancer for at least 20 years after your treatment. If Pap tests have been discontinued, your risk factors (such as having a new sexual partner) need to be reassessed to determine if screening should resume. Some women have medical problems that  increase the chance of getting cervical cancer. In these cases, your health care provider may recommend more frequent screening and Pap tests. Colorectal Cancer  This type of cancer can be detected and often prevented.  Routine colorectal cancer screening usually begins at 46 years of age and continues through 46 years of age.  Your health care provider may recommend screening at an earlier age if you have risk factors for colon cancer.  Your health care provider may also recommend using home test kits to check for hidden blood in the stool.  A small camera at the end of a tube can be used to examine your colon directly (sigmoidoscopy or colonoscopy). This is done to check for the earliest forms of colorectal cancer.  Routine screening usually begins at age 37.  Direct examination of the colon  should be repeated every 5-10 years through 46 years of age. However, you may need to be screened more often if early forms of precancerous polyps or small growths are found. Skin Cancer  Check your skin from head to toe regularly.  Tell your health care provider about any new moles or changes in moles, especially if there is a change in a mole's shape or color.  Also tell your health care provider if you have a mole that is larger than the size of a pencil eraser.  Always use sunscreen. Apply sunscreen liberally and repeatedly throughout the day.  Protect yourself by wearing long sleeves, pants, a wide-brimmed hat, and sunglasses whenever you are outside. HEART DISEASE, DIABETES, AND HIGH BLOOD PRESSURE   High blood pressure causes heart disease and increases the risk of stroke. High blood pressure is more likely to develop in:  People who have blood pressure in the high end of the normal range (130-139/85-89 mm Hg).  People who are overweight or obese.  People who are African American.  If you are 3-60 years of age, have your blood pressure checked every 3-5 years. If you are 25 years of  age or older, have your blood pressure checked every year. You should have your blood pressure measured twice--once when you are at a hospital or clinic, and once when you are not at a hospital or clinic. Record the average of the two measurements. To check your blood pressure when you are not at a hospital or clinic, you can use:  An automated blood pressure machine at a pharmacy.  A home blood pressure monitor.  If you are between 52 years and 4 years old, ask your health care provider if you should take aspirin to prevent strokes.  Have regular diabetes screenings. This involves taking a blood sample to check your fasting blood sugar level.  If you are at a normal weight and have a low risk for diabetes, have this test once every three years after 46 years of age.  If you are overweight and have a high risk for diabetes, consider being tested at a younger age or more often. PREVENTING INFECTION  Hepatitis B  If you have a higher risk for hepatitis B, you should be screened for this virus. You are considered at high risk for hepatitis B if:  You were born in a country where hepatitis B is common. Ask your health care provider which countries are considered high risk.  Your parents were born in a high-risk country, and you have not been immunized against hepatitis B (hepatitis B vaccine).  You have HIV or AIDS.  You use needles to inject street drugs.  You live with someone who has hepatitis B.  You have had sex with someone who has hepatitis B.  You get hemodialysis treatment.  You take certain medicines for conditions, including cancer, organ transplantation, and autoimmune conditions. Hepatitis C  Blood testing is recommended for:  Everyone born from [birth year] through 1966.  Anyone with known risk factors for hepatitis C. Sexually transmitted infections (STIs)  You should be screened for sexually transmitted infections (STIs) including gonorrhea and chlamydia if:  You are  sexually active and are younger than 46 years of age.  You are older than 46 years of age and your health care provider tells you that you are at risk for this type of infection.  Your sexual activity has changed since you were last screened and you are at an increased risk for  chlamydia or gonorrhea. Ask your health care provider if you are at risk.  If you do not have HIV, but are at risk, it may be recommended that you take a prescription medicine daily to prevent HIV infection. This is called pre-exposure prophylaxis (PrEP). You are considered at risk if:  You are sexually active and do not regularly use condoms or know the HIV status of your partner(s).  You take drugs by injection.  You are sexually active with a partner who has HIV. Talk with your health care provider about whether you are at high risk of being infected with HIV. If you choose to begin PrEP, you should first be tested for HIV. You should then be tested every 3 months for as long as you are taking PrEP.  PREGNANCY   If you are premenopausal and you may become pregnant, ask your health care provider about preconception counseling.  If you may become pregnant, take 400 to 800 micrograms (mcg) of folic acid every day.  If you want to prevent pregnancy, talk to your health care provider about birth control (contraception). OSTEOPOROSIS AND MENOPAUSE   Osteoporosis is a disease in which the bones lose minerals and strength with aging. This can result in serious bone fractures. Your risk for osteoporosis can be identified using a bone density scan.  If you are 25 years of age or older, or if you are at risk for osteoporosis and fractures, ask your health care provider if you should be screened.  Ask your health care provider whether you should take a calcium or vitamin D supplement to lower your risk for osteoporosis.  Menopause may have certain physical symptoms and risks.  Hormone replacement therapy may reduce some  of these symptoms and risks. Talk to your health care provider about whether hormone replacement therapy is right for you.  HOME CARE INSTRUCTIONS   Schedule regular health, dental, and eye exams.  Stay current with your immunizations.   Do not use any tobacco products including cigarettes, chewing tobacco, or electronic cigarettes.  If you are pregnant, do not drink alcohol.  If you are breastfeeding, limit how much and how often you drink alcohol.  Limit alcohol intake to no more than 1 drink per day for nonpregnant women. One drink equals 12 ounces of beer, 5 ounces of wine, or 1 ounces of hard liquor.  Do not use street drugs.  Do not share needles.  Ask your health care provider for help if you need support or information about quitting drugs.  Tell your health care provider if you often feel depressed.  Tell your health care provider if you have ever been abused or do not feel safe at home.   This information is not intended to replace advice given to you by your health care provider. Make sure you discuss any questions you have with your health care provider.   Document Released: 03/06/2011 Document Revised: 09/11/2014 Document Reviewed: 07/23/2013 Elsevier Interactive Patient Education [employer].

## 2016-04-07 ENCOUNTER — Encounter: Payer: Self-pay | Admitting: Internal Medicine

## 2016-04-08 ENCOUNTER — Encounter: Payer: Self-pay | Admitting: Internal Medicine

## 2016-04-08 DIAGNOSIS — I10 Essential (primary) hypertension: Secondary | ICD-10-CM | POA: Insufficient documentation

## 2016-04-08 NOTE — Assessment & Plan Note (Signed)
Annual comprehensive preventive exam was done as well as an evaluation and management of chronic conditions .  During the course of the visit the patient was educated and counseled about appropriate screening and preventive services including :  diabetes screening, lipid analysis with projected  10 year  risk for CAD , nutrition counseling, breast, cervical and colorectal cancer screening, and recommended immunizations.  Printed recommendations for health maintenance screenings was give 

## 2016-04-08 NOTE — Assessment & Plan Note (Signed)
Well controlled on current regimen. Renal function stable, no changes today.   Lab Results  Component Value Date   CREATININE 0.78 04/05/2016   Lab Results  Component Value Date   NA 140 04/05/2016   K 4.2 04/05/2016   CL 107 04/05/2016   CO2 25 04/05/2016

## 2016-04-08 NOTE — Assessment & Plan Note (Addendum)
I have addressed  BMI and recommended lifestyle changes that include following  a low glycemic index diet utilizing smaller more frequent meals to increase metabolism.  I have also recommended that patient start exercising with a goal of 30 minutes of aerobic exercise a minimum of 5 days per week. Screening for lipid disorders and diabetes to be done today.  Lab Results  Component Value Date   HGBA1C 5.3 04/05/2016   Lab Results  Component Value Date   CHOL 216 (H) 04/05/2016   HDL 47.10 04/05/2016   LDLCALC 143 (H) 04/05/2016   TRIG 133.0 04/05/2016   CHOLHDL 5 04/05/2016   Lab Results  Component Value Date   TSH 1.10 02/25/2015

## 2016-04-08 NOTE — Progress Notes (Signed)
Patient ID: Gabrielle Nelson, female    DOB: 1971/03/01  Age: 45 y.o. MRN: [id number]  The patient is here for her annual CPEn and management of other chronic and acute problems.   The risk factors are reflected in the social history.  The roster of all physicians providing medical care to patient - is listed in the Snapshot section of the chart.  Home safety : The patient has smoke detectors in the home. They wear seatbelts.  There are no firearms at home. There is no violence in the home.   There is no risks for hepatitis, STDs or HIV. There is no   history of blood transfusion. They have no travel history to infectious disease endemic areas of the world.  The patient has seen their dentist in the last six month. They have seen their eye doctor in the last year. \   They do not  have excessive sun exposure. Discussed the need for sun protection: hats, long sleeves and use of sunscreen if there is significant sun exposure.   Diet: the importance of a healthy diet is discussed. She does not  have a healthy diet, and she has t lost any weight since her visit in January . BMI and the benefits of regular aerobic exercise were discussed. She does not exercise regularly or at all. .   Depression screen: there are no signs or vegative symptoms of depression- irritability, change in appetite, anhedonia, sadness/tearfullness.   The following portions of the patient's history were reviewed and updated as appropriate: allergies, current medications, past family history, past medical history,  past surgical history, past social history  and problem list.  Visual acuity was not assessed per patient preference since she has regular follow up with her ophthalmologist. Hearing and body mass index were assessed and reviewed.   During the course of the visit the patient was educated and counseled about appropriate screening and preventive services including : fall prevention , diabetes screening, nutrition  counseling, colorectal cancer screening, and recommended immunizations.    CC: The primary encounter diagnosis was Visit for preventive health examination. Diagnoses of Breast cancer screening, Prediabetes, Essential hypertension, Hyperlipidemia, and Obesity (BMI 30-39.9) were also pertinent to this visit.  History Irma has a past medical history of Multiple thyroid nodules (April  2012).   She has no past surgical history on file.   Her family history includes Atrial fibrillation in her father.She reports that she quit smoking about 10 years ago. She has never used smokeless tobacco. She reports that she does not drink alcohol or use drugs.  Outpatient Medications Prior to Visit  Medication Sig Dispense Refill  . ALPRAZolam (XANAX) 0.5 MG tablet Take 1 tablet (0.5 mg total) by mouth at bedtime as needed for anxiety or sleep. 30 tablet 0  . Cholecalciferol (VITAMIN D3) 1000 units CAPS Take 2,000 Units by mouth daily.    . fish oil-omega-3 fatty acids 1000 MG capsule Take 1,200 mg by mouth 4 (four) times daily.     Marland Kitchen glucosamine-chondroitin 500-400 MG tablet Take 2 tablets by mouth daily.     Marland Kitchen lisinopril (PRINIVIL,ZESTRIL) 40 MG tablet TAKE ONE TABLET BY MOUTH EVERY DAY 90 tablet 3  . loratadine (CLARITIN) 10 MG tablet TAKE ONE TABLET BY MOUTH EVERY DAY 90 tablet 1  . terbinafine (LAMISIL) 250 MG tablet Take 1 tablet (250 mg total) by mouth daily. 30 tablet 1  . meloxicam (MOBIC) 15 MG tablet TAKE 1 TABLET BY MOUTH EVERY DAY 90  tablet 1  . ranitidine (ZANTAC) 150 MG tablet TAKE ONE TABLET BY MOUTH EVERY DAY 90 tablet 3   No facility-administered medications prior to visit.     Review of Systems   Patient denies headache, fevers, malaise, unintentional weight loss, skin rash, eye pain, sinus congestion and sinus pain, sore throat, dysphagia,  hemoptysis , cough, dyspnea, wheezing, chest pain, palpitations, orthopnea, edema, abdominal pain, nausea, melena, diarrhea, constipation, flank  pain, dysuria, hematuria, urinary  Frequency, nocturia, numbness, tingling, seizures,  Focal weakness, Loss of consciousness,  Tremor, insomnia, depression, anxiety, and suicidal ideation.      Objective:  BP 116/80   Pulse 84   Temp 97.7 F (36.5 C) (Oral)   Ht 5' 4.5" (1.638 m)   Wt 206 lb 8 oz (93.7 kg)   LMP 03/30/2016   SpO2 96%   BMI 34.90 kg/m   Physical Exam   General appearance: alert, cooperative and appears stated age Head: Normocephalic, without obvious abnormality, atraumatic Eyes: conjunctivae/corneas clear. PERRL, EOM's intact. Fundi benign. Ears: normal TM's and external ear canals both ears Nose: Nares normal. Septum midline. Mucosa normal. No drainage or sinus tenderness. Throat: lips, mucosa, and tongue normal; teeth and gums normal Neck: no adenopathy, no carotid bruit, no JVD, supple, symmetrical, trachea midline and thyroid not enlarged, symmetric, no tenderness/mass/nodules Lungs: clear to auscultation bilaterally Breasts: normal appearance, no masses or tenderness Heart: regular rate and rhythm, S1, S2 normal, no murmur, click, rub or gallop Abdomen: soft, non-tender; bowel sounds normal; no masses,  no organomegaly Extremities: extremities normal, atraumatic, no cyanosis or edema Pulses: 2+ and symmetric Skin: Skin color, texture, turgor normal. No rashes or lesions Neurologic: Alert and oriented X 3, normal strength and tone. Normal symmetric reflexes. Normal coordination and gait.     Assessment & Plan:   Problem List Items Addressed This Visit    Obesity (BMI 30-39.9)    I have addressed  BMI and recommended lifestyle changes that include following  a low glycemic index diet utilizing smaller more frequent meals to increase metabolism.  I have also recommended that patient start exercising with a goal of 30 minutes of aerobic exercise a minimum of 5 days per week. Screening for lipid disorders and diabetes to be done today.  Lab Results   Component Value Date   HGBA1C 5.3 04/05/2016   Lab Results  Component Value Date   CHOL 216 (H) 04/05/2016   HDL 47.10 04/05/2016   LDLCALC 143 (H) 04/05/2016   TRIG 133.0 04/05/2016   CHOLHDL 5 04/05/2016   Lab Results  Component Value Date   TSH 1.10 02/25/2015           Hyperlipidemia    Based on current lipid profile, the risk of clinically significant CAD is 4.5%  over the next 10 years, using the Framingham risk calculator.no therapy indicated a this time.   Lab Results  Component Value Date   CHOL 216 (H) 04/05/2016   HDL 47.10 04/05/2016   LDLCALC 143 (H) 04/05/2016   TRIG 133.0 04/05/2016   CHOLHDL 5 04/05/2016         Relevant Orders   Lipid panel (Completed)   Visit for preventive health examination - Primary    Annual comprehensive preventive exam was done as well as an evaluation and management of chronic conditions .  During the course of the visit the patient was educated and counseled about appropriate screening and preventive services including :  diabetes screening, lipid analysis with projected  10 year  risk for CAD , nutrition counseling, breast, cervical and colorectal cancer screening, and recommended immunizations.  Printed recommendations for health maintenance screenings was give      Essential hypertension    Well controlled on current regimen. Renal function stable, no changes today.   Lab Results  Component Value Date   CREATININE 0.78 04/05/2016   Lab Results  Component Value Date   NA 140 04/05/2016   K 4.2 04/05/2016   CL 107 04/05/2016   CO2 25 04/05/2016         Relevant Orders   Comprehensive metabolic panel (Completed)   Breast cancer screening   Relevant Orders   MM DIGITAL SCREENING BILATERAL    Other Visit Diagnoses    Prediabetes       Relevant Orders   Hemoglobin A1c (Completed)      I have changed Ms. Rae's meloxicam and ranitidine. I am also having her maintain her fish oil-omega-3 fatty acids,  glucosamine-chondroitin, ALPRAZolam, loratadine, terbinafine, Vitamin D3, and lisinopril.  Meds ordered this encounter  Medications  . meloxicam (MOBIC) 15 MG tablet    Sig: Take 1 tablet (15 mg total) by mouth daily.    Dispense:  90 tablet    Refill:  1  . ranitidine (ZANTAC) 150 MG tablet    Sig: Take 1 tablet (150 mg total) by mouth daily.    Dispense:  90 tablet    Refill:  3    Medications Discontinued During This Encounter  Medication Reason  . meloxicam (MOBIC) 15 MG tablet Reorder  . ranitidine (ZANTAC) 150 MG tablet Reorder    Follow-up: No Follow-up on file.   Crecencio Mc, MD

## 2016-04-08 NOTE — Assessment & Plan Note (Addendum)
Based on current lipid profile, the risk of clinically significant CAD is 4.5%  over the next 10 years, using the Framingham risk calculator.no therapy indicated a this time.   Lab Results  Component Value Date   CHOL 216 (H) 04/05/2016   HDL 47.10 04/05/2016   LDLCALC 143 (H) 04/05/2016   TRIG 133.0 04/05/2016   CHOLHDL 5 04/05/2016

## 2016-11-06 ENCOUNTER — Ambulatory Visit (INDEPENDENT_AMBULATORY_CARE_PROVIDER_SITE_OTHER): Payer: BC Managed Care – PPO | Admitting: Internal Medicine

## 2016-11-06 ENCOUNTER — Encounter: Payer: Self-pay | Admitting: Internal Medicine

## 2016-11-06 VITALS — BP 130/78 | HR 78 | Resp 16 | Wt 208.0 lb

## 2016-11-06 DIAGNOSIS — E042 Nontoxic multinodular goiter: Secondary | ICD-10-CM | POA: Diagnosis not present

## 2016-11-06 DIAGNOSIS — E7800 Pure hypercholesterolemia, unspecified: Secondary | ICD-10-CM | POA: Diagnosis not present

## 2016-11-06 DIAGNOSIS — I10 Essential (primary) hypertension: Secondary | ICD-10-CM | POA: Diagnosis not present

## 2016-11-06 DIAGNOSIS — Z1231 Encounter for screening mammogram for malignant neoplasm of breast: Secondary | ICD-10-CM | POA: Diagnosis not present

## 2016-11-06 DIAGNOSIS — Z1239 Encounter for other screening for malignant neoplasm of breast: Secondary | ICD-10-CM

## 2016-11-06 LAB — COMPREHENSIVE METABOLIC PANEL
ALBUMIN: 4.1 g/dL (ref 3.5–5.2)
ALK PHOS: 50 U/L (ref 39–117)
ALT: 12 U/L (ref 0–35)
AST: 12 U/L (ref 0–37)
BILIRUBIN TOTAL: 0.3 mg/dL (ref 0.2–1.2)
BUN: 14 mg/dL (ref 6–23)
CHLORIDE: 107 meq/L (ref 96–112)
CO2: 27 meq/L (ref 19–32)
Calcium: 9.2 mg/dL (ref 8.4–10.5)
Creatinine, Ser: 0.74 mg/dL (ref 0.40–1.20)
GFR: 89.64 mL/min (ref >60.00)
Glucose, Bld: 98 mg/dL (ref 70–99)
POTASSIUM: 4.6 meq/L (ref 3.5–5.1)
SODIUM: 140 meq/L (ref 135–145)
Total Protein: 6.3 g/dL (ref 6.0–8.3)

## 2016-11-06 LAB — LIPID PANEL
CHOL/HDL RATIO: 4
Cholesterol: 204 mg/dL — ABNORMAL HIGH (ref 0–200)
HDL: 45.6 mg/dL (ref >39.00)
LDL Cholesterol: 137 mg/dL — ABNORMAL HIGH (ref 0–99)
NONHDL: 158.08
Triglycerides: 104 mg/dL (ref 0.0–149.0)
VLDL: 20.8 mg/dL (ref 0.0–40.0)

## 2016-11-06 LAB — MICROALBUMIN / CREATININE URINE RATIO
CREATININE, U: 298.9 mg/dL
MICROALB UR: 1.9 mg/dL (ref 0.0–1.9)
MICROALB/CREAT RATIO: 0.6 mg/g (ref 0.0–30.0)

## 2016-11-06 LAB — TSH: TSH: 0.75 u[IU]/mL (ref 0.35–4.50)

## 2016-11-06 MED ORDER — MELOXICAM 15 MG PO TABS: 15.0000 mg | ORAL_TABLET | Freq: Every day | ORAL | 1 refills | Status: DC

## 2016-11-06 MED ORDER — METRONIDAZOLE 1 % EX GEL: Freq: Every day | CUTANEOUS | 0 refills | Status: DC

## 2016-11-06 MED ORDER — LOSARTAN POTASSIUM 100 MG PO TABS: 100.0000 mg | ORAL_TABLET | Freq: Every day | ORAL | 3 refills | Status: DC

## 2016-11-06 NOTE — Progress Notes (Signed)
Subjective:  Patient ID: Gabrielle Nelson, female    DOB: 1971-06-24  Age: 46 y.o. MRN: [id number]  CC: The primary encounter diagnosis was Multiple thyroid nodules. Diagnoses of Pure hypercholesterolemia, Essential hypertension, Breast cancer screening, and Morbid obesity ([place]) were also pertinent to this visit.  HPI Gabrielle Nelson presents for 6 month follow up on hypertension. Patient is taking her medications as prescribed and notes no adverse effects.  Home BP readings have been done about once per week and are  generally > 130/80 .  She is avoiding added salt in her diet .  She is not walking regularly for exercise, due to joint pain and daily commute .  Using mobic for joint pain  Involving knees,  Along with glucosamine ,  Hips bothering her some too   Blood pressure elevated  On lisinopril 40 mg daily.  Changing to losartan 100 mg  Diet reviewed  Outpatient Medications Prior to Visit  Medication Sig Dispense Refill  . Cholecalciferol (VITAMIN D3) 1000 units CAPS Take 2,000 Units by mouth daily.    . fish oil-omega-3 fatty acids 1000 MG capsule Take 1,200 mg by mouth 4 (four) times daily.     Marland Kitchen glucosamine-chondroitin 500-400 MG tablet Take 2 tablets by mouth daily.     . ranitidine (ZANTAC) 150 MG tablet Take 1 tablet (150 mg total) by mouth daily. 90 tablet 3  . terbinafine (LAMISIL) 250 MG tablet Take 1 tablet (250 mg total) by mouth daily. 30 tablet 1  . ALPRAZolam (XANAX) 0.5 MG tablet Take 1 tablet (0.5 mg total) by mouth at bedtime as needed for anxiety or sleep. 30 tablet 0  . lisinopril (PRINIVIL,ZESTRIL) 40 MG tablet TAKE ONE TABLET BY MOUTH EVERY DAY 90 tablet 3  . loratadine (CLARITIN) 10 MG tablet TAKE ONE TABLET BY MOUTH EVERY DAY 90 tablet 1  . meloxicam (MOBIC) 15 MG tablet Take 1 tablet (15 mg total) by mouth daily. 90 tablet 1   No facility-administered medications prior to visit.     Review of Systems;  Patient denies headache, fevers, malaise, unintentional  weight loss, skin rash, eye pain, sinus congestion and sinus pain, sore throat, dysphagia,  hemoptysis , cough, dyspnea, wheezing, chest pain, palpitations, orthopnea, edema, abdominal pain, nausea, melena, diarrhea, constipation, flank pain, dysuria, hematuria, urinary  Frequency, nocturia, numbness, tingling, seizures,  Focal weakness, Loss of consciousness,  Tremor, insomnia, depression, anxiety, and suicidal ideation.      Objective:  BP 130/78   Pulse 78   Resp 16   Wt 208 lb (94.3 kg)   SpO2 97%   BMI 35.15 kg/m   BP Readings from Last 3 Encounters:  11/06/16 130/78  04/05/16 116/80  10/04/15 112/80    Wt Readings from Last 3 Encounters:  11/06/16 208 lb (94.3 kg)  04/05/16 206 lb 8 oz (93.7 kg)  10/04/15 207 lb 8 oz (94.1 kg)    General appearance: alert, cooperative and appears stated age Ears: normal TM's and external ear canals both ears Throat: lips, mucosa, and tongue normal; teeth and gums normal Neck: no adenopathy, no carotid bruit, supple, symmetrical, trachea midline and thyroid not enlarged, symmetric, no tenderness/mass/nodules Back: symmetric, no curvature. ROM normal. No CVA tenderness. Lungs: clear to auscultation bilaterally Heart: regular rate and rhythm, S1, S2 normal, no murmur, click, rub or gallop Abdomen: soft, non-tender; bowel sounds normal; no masses,  no organomegaly Pulses: 2+ and symmetric Skin: Skin color, texture, turgor normal. No rashes or lesions Lymph nodes:  Cervical, supraclavicular, and axillary nodes normal.  Lab Results  Component Value Date   HGBA1C 5.3 04/05/2016    Lab Results  Component Value Date   CREATININE 0.74 11/06/2016   CREATININE 0.78 04/05/2016   CREATININE 0.85 10/04/2015    Lab Results  Component Value Date   WBC 5.0 01/08/2014   HGB 12.9 01/08/2014   HCT 37.9 01/08/2014   PLT 220.0 01/08/2014   GLUCOSE 98 11/06/2016   CHOL 204 (H) 11/06/2016   TRIG 104.0 11/06/2016   HDL 45.60 11/06/2016   LDLCALC  137 (H) 11/06/2016   ALT 12 11/06/2016   AST 12 11/06/2016   NA 140 11/06/2016   K 4.6 11/06/2016   CL 107 11/06/2016   CREATININE 0.74 11/06/2016   BUN 14 11/06/2016   CO2 27 11/06/2016   TSH 0.75 11/06/2016   HGBA1C 5.3 04/05/2016   MICROALBUR 1.9 11/06/2016    No results found.  Assessment & Plan:   Problem List Items Addressed This Visit    Breast cancer screening    mammogram overdue       Relevant Orders   MM SCREENING BREAST TOMO BILATERAL   Essential hypertension    Not at goal on maximal lisinopril dose,  Changing to losartan 100 mg daily .  Lab Results  Component Value Date   CREATININE 0.74 11/06/2016   Lab Results  Component Value Date   NA 140 11/06/2016   K 4.6 11/06/2016   CL 107 11/06/2016   CO2 27 11/06/2016         Relevant Medications   losartan (COZAAR) 100 MG tablet   Other Relevant Orders   Comprehensive metabolic panel (Completed)   Microalbumin / creatinine urine ratio (Completed)   Hyperlipidemia    Based on current lipid profile, the risk of clinically significant CAD is  3.4%  over the next 10 years, using the Framingham risk calculator.no therapy indicated a this time.   Lab Results  Component Value Date   CHOL 204 (H) 11/06/2016   HDL 45.60 11/06/2016   LDLCALC 137 (H) 11/06/2016   TRIG 104.0 11/06/2016   CHOLHDL 4 11/06/2016         Relevant Medications   losartan (COZAAR) 100 MG tablet   Other Relevant Orders   Lipid panel (Completed)   Morbid obesity ([place])    Secondary to BMI 35 and concurrent hypertension. I have addressed  BMI and recommended wt loss of 10% of body weigh over the next 6 months using a low glycemic index diet and regular exercise a minimum of 5 days per week.   Body mass index is 35.15 kg/m.        Multiple thyroid nodules - Primary    She never followed up with Dr. 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  Thyroid function is normal.    Lab Results  Component Value Date   TSH 0.75 11/06/2016             Relevant Orders   TSH (Completed)     A total of 25 minutes of face to face time was spent with patient more than half of which was spent in counselling about the above mentioned conditions  and coordination of care  I have discontinued Ms. Leu's ALPRAZolam, loratadine, and lisinopril. I am also having her start on losartan and metroNIDAZOLE. Additionally, I am having her maintain her fish oil-omega-3 fatty acids, glucosamine-chondroitin, terbinafine, Vitamin D3, ranitidine, fexofenadine, and meloxicam.  Meds ordered this encounter  Medications  . fexofenadine (  ALLEGRA) 180 MG tablet    Sig: Take 180 mg by mouth daily.  . meloxicam (MOBIC) 15 MG tablet    Sig: Take 1 tablet (15 mg total) by mouth daily.    Dispense:  90 tablet    Refill:  1  . losartan (COZAAR) 100 MG tablet    Sig: Take 1 tablet (100 mg total) by mouth daily.    Dispense:  90 tablet    Refill:  3  . metroNIDAZOLE (METROGEL) 1 % gel    Sig: Apply topically daily.    Dispense:  45 g    Refill:  0    Medications Discontinued During This Encounter  Medication Reason  . loratadine (CLARITIN) 10 MG tablet Patient has not taken in last 30 days  . ALPRAZolam (XANAX) 0.5 MG tablet Patient has not taken in last 30 days  . lisinopril (PRINIVIL,ZESTRIL) 40 MG tablet   . meloxicam (MOBIC) 15 MG tablet Reorder    Follow-up: Return in about 6 months (around 05/09/2017), or cpe.   Crecencio Mc, MD

## 2016-11-06 NOTE — Patient Instructions (Addendum)
THIS IS THE YEAR YOU NEED TO BREAK 200 LBS!!!!  I want you to start walking  3 times daily    Rosacea Rosacea is a long-term (chronic) condition that affects the skin of the face, including the cheeks, nose, brow, and chin. This condition can also affect the eyes. Rosacea causes blood vessels near the surface of the skin to get bigger (be enlarged), and that makes the skin red. There is no cure for this condition, but treatment can help to control your symptoms. Follow these instructions at home: Skin Care  Take care of your skin as told by your doctor. Your doctor may tell you do these things:  Wash your skin gently two or more times each day.  Use mild soap.  Use a sunscreen or sunblock with SPF 30 or greater.  Use gentle cosmetics that are meant for sensitive skin.  Shave with an electric shaver instead of a blade. Lifestyle   Try to keep track of what foods trigger this condition. Avoid any triggers. These may include:  Spicy foods.  Seafood.  Cheese.  Hot liquids.  Nuts.  Chocolate.  Iodized salt.  Do not drink alcohol.  Avoid extremely cold or hot temperatures.  Try to reduce your stress. If you need help to do this, talk with your doctor.  When you exercise, do these things to stay cool:  Limit your sun exposure.  Use a fan.  Exercise for a shorter time, and exercise more often. General instructions   Keep all follow-up visits as told by your doctor. This is important.  Take over-the-counter and prescription medicines only as told by your doctor.  If your eyelids are affected, press warm compresses to them. Do this as told by your doctor.  If you were prescribed an antibiotic medicine, apply or take it as told by your doctor. Do not stop using the antibiotic even if your condition improves. Contact a doctor if:  Your symptoms get worse.  Your symptoms do not improve after two months of treatment.  You have new symptoms.  You have any changes  in vision or you have problems with your eyes, such as redness or itching.  You feel very sad (depressed).  You lose your appetite.  You have trouble concentrating. This information is not intended to replace advice given to you by your health care provider. Make sure you discuss any questions you have with your health care provider. Document Released: 11/13/2011 Document Revised: 01/27/2016 Document Reviewed: 10/28/2014 Elsevier Interactive Patient Education  2017 [employer].

## 2016-11-06 NOTE — Progress Notes (Signed)
Pre visit review using our clinic review tool, if applicable. No additional management support is needed unless otherwise documented below in the visit note. 

## 2016-11-07 NOTE — Assessment & Plan Note (Addendum)
Not at goal on maximal lisinopril dose,  Changing to losartan 100 mg daily .  Lab Results  Component Value Date   CREATININE 0.74 11/06/2016   Lab Results  Component Value Date   NA 140 11/06/2016   K 4.6 11/06/2016   CL 107 11/06/2016   CO2 27 11/06/2016

## 2016-11-07 NOTE — Assessment & Plan Note (Signed)
mammogram overdue

## 2016-11-07 NOTE — Assessment & Plan Note (Addendum)
Based on current lipid profile, the risk of clinically significant CAD is  3.4%  over the next 10 years, using the Framingham risk calculator.no therapy indicated a this time.   Lab Results  Component Value Date   CHOL 204 (H) 11/06/2016   HDL 45.60 11/06/2016   LDLCALC 137 (H) 11/06/2016   TRIG 104.0 11/06/2016   CHOLHDL 4 11/06/2016

## 2016-11-07 NOTE — Assessment & Plan Note (Signed)
She never followed up with Dr. 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  Thyroid function is normal.    Lab Results  Component Value Date   TSH 0.75 11/06/2016

## 2016-11-07 NOTE — Assessment & Plan Note (Addendum)
Secondary to BMI 35 and concurrent hypertension. I have addressed  BMI and recommended wt loss of 10% of body weigh over the next 6 months using a low glycemic index diet and regular exercise a minimum of 5 days per week.   Body mass index is 35.15 kg/m.

## 2016-11-08 ENCOUNTER — Encounter: Payer: Self-pay | Admitting: Internal Medicine

## 2017-02-23 ENCOUNTER — Encounter: Payer: Self-pay | Admitting: Internal Medicine

## 2017-02-23 NOTE — Telephone Encounter (Signed)
Please advise, not on current med list, thanks

## 2017-02-24 ENCOUNTER — Telehealth: Payer: Self-pay | Admitting: Internal Medicine

## 2017-02-24 MED ORDER — ALPRAZOLAM 0.5 MG PO TABS: 0.5000 mg | ORAL_TABLET | Freq: Every evening | ORAL | 2 refills | Status: DC | PRN

## 2017-02-24 NOTE — Telephone Encounter (Signed)
when you don't see a  medication on a patient's active list that is being requested to refill,  That means that the refills have run out so, look for it under  "Historical" .  You can  refill her alprazolam on Monday for #, 30 with 2 refills,  But she will need follow up in September . .thanks

## 2017-02-26 NOTE — Telephone Encounter (Signed)
Called Rx into the pharmacy to [facility]. thanks

## 2017-04-08 ENCOUNTER — Other Ambulatory Visit: Payer: Self-pay | Admitting: Internal Medicine

## 2017-05-10 ENCOUNTER — Encounter: Payer: BC Managed Care – PPO | Admitting: Internal Medicine

## 2017-06-05 ENCOUNTER — Other Ambulatory Visit: Payer: Self-pay | Admitting: Internal Medicine

## 2017-06-18 ENCOUNTER — Other Ambulatory Visit (HOSPITAL_COMMUNITY)
Admission: RE | Admit: 2017-06-18 | Discharge: 2017-06-18 | Disposition: A | Discharge status: Home / Self Care | Payer: BC Managed Care – PPO | Source: Ambulatory Visit | Attending: Internal Medicine | Admitting: Internal Medicine

## 2017-06-18 ENCOUNTER — Ambulatory Visit (INDEPENDENT_AMBULATORY_CARE_PROVIDER_SITE_OTHER): Payer: BC Managed Care – PPO | Admitting: Internal Medicine

## 2017-06-18 ENCOUNTER — Encounter: Payer: Self-pay | Admitting: Internal Medicine

## 2017-06-18 VITALS — BP 128/76 | HR 67 | Temp 98.1°F | Resp 15 | Ht 64.5 in | Wt 213.2 lb

## 2017-06-18 DIAGNOSIS — E785 Hyperlipidemia, unspecified: Secondary | ICD-10-CM | POA: Diagnosis not present

## 2017-06-18 DIAGNOSIS — E7800 Pure hypercholesterolemia, unspecified: Secondary | ICD-10-CM | POA: Diagnosis not present

## 2017-06-18 DIAGNOSIS — Z124 Encounter for screening for malignant neoplasm of cervix: Secondary | ICD-10-CM | POA: Diagnosis not present

## 2017-06-18 DIAGNOSIS — Z23 Encounter for immunization: Secondary | ICD-10-CM

## 2017-06-18 DIAGNOSIS — Z0000 Encounter for general adult medical examination without abnormal findings: Secondary | ICD-10-CM | POA: Insufficient documentation

## 2017-06-18 DIAGNOSIS — E559 Vitamin D deficiency, unspecified: Secondary | ICD-10-CM | POA: Diagnosis not present

## 2017-06-18 DIAGNOSIS — I10 Essential (primary) hypertension: Secondary | ICD-10-CM | POA: Diagnosis not present

## 2017-06-18 DIAGNOSIS — E042 Nontoxic multinodular goiter: Secondary | ICD-10-CM

## 2017-06-18 LAB — LIPID PANEL
Cholesterol: 208 mg/dL — ABNORMAL HIGH (ref 0–200)
HDL: 45.8 mg/dL (ref >39.00)
LDL CALC: 132 mg/dL — AB (ref 0–99)
NonHDL: 161.79
Total CHOL/HDL Ratio: 5
Triglycerides: 150 mg/dL — ABNORMAL HIGH (ref 0.0–149.0)
VLDL: 30 mg/dL (ref 0.0–40.0)

## 2017-06-18 LAB — COMPREHENSIVE METABOLIC PANEL
ALT: 14 U/L (ref 0–35)
AST: 15 U/L (ref 0–37)
Albumin: 4 g/dL (ref 3.5–5.2)
Alkaline Phosphatase: 55 U/L (ref 39–117)
BUN: 13 mg/dL (ref 6–23)
CALCIUM: 9.1 mg/dL (ref 8.4–10.5)
CHLORIDE: 103 meq/L (ref 96–112)
CO2: 24 meq/L (ref 19–32)
CREATININE: 0.77 mg/dL (ref 0.40–1.20)
GFR: 85.39 mL/min (ref >60.00)
Glucose, Bld: 103 mg/dL — ABNORMAL HIGH (ref 70–99)
POTASSIUM: 3.8 meq/L (ref 3.5–5.1)
SODIUM: 138 meq/L (ref 135–145)
Total Bilirubin: 0.4 mg/dL (ref 0.2–1.2)
Total Protein: 6.6 g/dL (ref 6.0–8.3)

## 2017-06-18 LAB — TSH: TSH: 1.2 u[IU]/mL (ref 0.35–4.50)

## 2017-06-18 LAB — VITAMIN D 25 HYDROXY (VIT D DEFICIENCY, FRACTURES): VITD: 36.52 ng/mL (ref 30.00–100.00)

## 2017-06-18 NOTE — Patient Instructions (Signed)
I want you to lose 10 lbs by the time you go to [place]!  Add 30 minutes of exercise 5 days per week   Health Maintenance, Female Adopting a healthy lifestyle and getting preventive care can go a long way to promote health and wellness. Talk with your health care provider about what schedule of regular examinations is right for you. This is a good chance for you to check in with your provider about disease prevention and staying healthy. In between checkups, there are plenty of things you can do on your own. Experts have done a lot of research about which lifestyle changes and preventive measures are most likely to keep you healthy. Ask your health care provider for more information. Weight and diet Eat a healthy diet  Be sure to include plenty of vegetables, fruits, low-fat dairy products, and lean protein.  Do not eat a lot of foods high in solid fats, added sugars, or salt.  Get regular exercise. This is one of the most important things you can do for your health. ? Most adults should exercise for at least 150 minutes each week. The exercise should increase your heart rate and make you sweat (moderate-intensity exercise). ? Most adults should also do strengthening exercises at least twice a week. This is in addition to the moderate-intensity exercise.  Maintain a healthy weight  Body mass index (BMI) is a measurement that can be used to identify possible weight problems. It estimates body fat based on height and weight. Your health care provider can help determine your BMI and help you achieve or maintain a healthy weight.  For females 68 years of age and older: ? A BMI below 18.5 is considered underweight. ? A BMI of 18.5 to 24.9 is normal. ? A BMI of 25 to 29.9 is considered overweight. ? A BMI of 30 and above is considered obese.  Watch levels of cholesterol and blood lipids  You should start having your blood tested for lipids and cholesterol at 47 years of age, then have this  test every 5 years.  You may need to have your cholesterol levels checked more often if: ? Your lipid or cholesterol levels are high. ? You are older than 47 years of age. ? You are at high risk for heart disease.  Cancer screening Lung Cancer  Lung cancer screening is recommended for adults 40-21 years old who are at high risk for lung cancer because of a history of smoking.  A yearly low-dose CT scan of the lungs is recommended for people who: ? Currently smoke. ? Have quit within the past 15 years. ? Have at least a 30-pack-year history of smoking. A pack year is smoking an average of one pack of cigarettes a day for 1 year.  Yearly screening should continue until it has been 47 years since you quit.  Yearly screening should stop if you develop a health problem that would prevent you from having lung cancer treatment.  Breast Cancer  Practice breast self-awareness. This means understanding how your breasts normally appear and feel.  It also means doing regular breast self-exams. Let your health care provider know about any changes, no matter how small.  If you are in your 47s or 30s, you should have a clinical breast exam (CBE) by a health care provider every 1-3 years as part of a regular health exam.  If you are 47 or older, have a CBE every year. Also consider having a breast X-ray (mammogram) every year.  If you have a family history of breast cancer, talk to your health care provider about genetic screening.  If you are at high risk for breast cancer, talk to your health care provider about having an MRI and a mammogram every year.  Breast cancer gene (BRCA) assessment is recommended for women who have family members with BRCA-related cancers. BRCA-related cancers include: ? Breast. ? Ovarian. ? Tubal. ? Peritoneal cancers.  Results of the assessment will determine the need for genetic counseling and BRCA1 and BRCA2 testing.  Cervical Cancer Your health care  provider may recommend that you be screened regularly for cancer of the pelvic organs (ovaries, uterus, and vagina). This screening involves a pelvic examination, including checking for microscopic changes to the surface of your cervix (Pap test). You may be encouraged to have this screening done every 3 years, beginning at age 47.  For women ages 47-65, health care providers may recommend pelvic exams and Pap testing every 3 years, or they may recommend the Pap and pelvic exam, combined with testing for human papilloma virus (HPV), every 5 years. Some types of HPV increase your risk of cervical cancer. Testing for HPV may also be done on women of any age with unclear Pap test results.  Other health care providers may not recommend any screening for nonpregnant women who are considered low risk for pelvic cancer and who do not have symptoms. Ask your health care provider if a screening pelvic exam is right for you.  If you have had past treatment for cervical cancer or a condition that could lead to cancer, you need Pap tests and screening for cancer for at least 20 years after your treatment. If Pap tests have been discontinued, your risk factors (such as having a new sexual partner) need to be reassessed to determine if screening should resume. Some women have medical problems that increase the chance of getting cervical cancer. In these cases, your health care provider may recommend more frequent screening and Pap tests.  Colorectal Cancer  This type of cancer can be detected and often prevented.  Routine colorectal cancer screening usually begins at 47 years of age and continues through 47 years of age.  Your health care provider may recommend screening at an earlier age if you have risk factors for colon cancer.  Your health care provider may also recommend using home test kits to check for hidden blood in the stool.  A small camera at the end of a tube can be used to examine your colon  directly (sigmoidoscopy or colonoscopy). This is done to check for the earliest forms of colorectal cancer.  Routine screening usually begins at age 47.  Direct examination of the colon should be repeated every 5-10 years through 47 years of age. However, you may need to be screened more often if early forms of precancerous polyps or small growths are found.  Skin Cancer  Check your skin from head to toe regularly.  Tell your health care provider about any new moles or changes in moles, especially if there is a change in a mole's shape or color.  Also tell your health care provider if you have a mole that is larger than the size of a pencil eraser.  Always use sunscreen. Apply sunscreen liberally and repeatedly throughout the day.  Protect yourself by wearing long sleeves, pants, a wide-brimmed hat, and sunglasses whenever you are outside.  Heart disease, diabetes, and high blood pressure  High blood pressure causes heart disease and  increases the risk of stroke. High blood pressure is more likely to develop in: ? People who have blood pressure in the high end of the normal range (130-139/85-89 mm Hg). ? People who are overweight or obese. ? People who are African American.  If you are 34-35 years of age, have your blood pressure checked every 3-5 years. If you are 32 years of age or older, have your blood pressure checked every year. You should have your blood pressure measured twice-once when you are at a hospital or clinic, and once when you are not at a hospital or clinic. Record the average of the two measurements. To check your blood pressure when you are not at a hospital or clinic, you can use: ? An automated blood pressure machine at a pharmacy. ? A home blood pressure monitor.  If you are between 25 years and 42 years old, ask your health care provider if you should take aspirin to prevent strokes.  Have regular diabetes screenings. This involves taking a blood sample to  check your fasting blood sugar level. ? If you are at a normal weight and have a low risk for diabetes, have this test once every three years after 47 years of age. ? If you are overweight and have a high risk for diabetes, consider being tested at a younger age or more often. Preventing infection Hepatitis B  If you have a higher risk for hepatitis B, you should be screened for this virus. You are considered at high risk for hepatitis B if: ? You were born in a country where hepatitis B is common. Ask your health care provider which countries are considered high risk. ? Your parents were born in a high-risk country, and you have not been immunized against hepatitis B (hepatitis B vaccine). ? You have HIV or AIDS. ? You use needles to inject street drugs. ? You live with someone who has hepatitis B. ? You have had sex with someone who has hepatitis B. ? You get hemodialysis treatment. ? You take certain medicines for conditions, including cancer, organ transplantation, and autoimmune conditions.  Hepatitis C  Blood testing is recommended for: ? Everyone born from [birth year] through 1966. ? Anyone with known risk factors for hepatitis C.  Sexually transmitted infections (STIs)  You should be screened for sexually transmitted infections (STIs) including gonorrhea and chlamydia if: ? You are sexually active and are younger than 47 years of age. ? You are older than 47 years of age and your health care provider tells you that you are at risk for this type of infection. ? Your sexual activity has changed since you were last screened and you are at an increased risk for chlamydia or gonorrhea. Ask your health care provider if you are at risk.  If you do not have HIV, but are at risk, it may be recommended that you take a prescription medicine daily to prevent HIV infection. This is called pre-exposure prophylaxis (PrEP). You are considered at risk if: ? You are sexually active and do not regularly  use condoms or know the HIV status of your partner(s). ? You take drugs by injection. ? You are sexually active with a partner who has HIV.  Talk with your health care provider about whether you are at high risk of being infected with HIV. If you choose to begin PrEP, you should first be tested for HIV. You should then be tested every 3 months for as long as you are taking  PrEP. Pregnancy  If you are premenopausal and you may become pregnant, ask your health care provider about preconception counseling.  If you may become pregnant, take 400 to 800 micrograms (mcg) of folic acid every day.  If you want to prevent pregnancy, talk to your health care provider about birth control (contraception). Osteoporosis and menopause  Osteoporosis is a disease in which the bones lose minerals and strength with aging. This can result in serious bone fractures. Your risk for osteoporosis can be identified using a bone density scan.  If you are 24 years of age or older, or if you are at risk for osteoporosis and fractures, ask your health care provider if you should be screened.  Ask your health care provider whether you should take a calcium or vitamin D supplement to lower your risk for osteoporosis.  Menopause may have certain physical symptoms and risks.  Hormone replacement therapy may reduce some of these symptoms and risks. Talk to your health care provider about whether hormone replacement therapy is right for you. Follow these instructions at home:  Schedule regular health, dental, and eye exams.  Stay current with your immunizations.  Do not use any tobacco products including cigarettes, chewing tobacco, or electronic cigarettes.  If you are pregnant, do not drink alcohol.  If you are breastfeeding, limit how much and how often you drink alcohol.  Limit alcohol intake to no more than 1 drink per day for nonpregnant women. One drink equals 12 ounces of beer, 5 ounces of wine, or 1 ounces  of hard liquor.  Do not use street drugs.  Do not share needles.  Ask your health care provider for help if you need support or information about quitting drugs.  Tell your health care provider if you often feel depressed.  Tell your health care provider if you have ever been abused or do not feel safe at home. This information is not intended to replace advice given to you by your health care provider. Make sure you discuss any questions you have with your health care provider. Document Released: 03/06/2011 Document Revised: 01/27/2016 Document Reviewed: 05/25/2015 Elsevier Interactive Patient Education  [employer].

## 2017-06-18 NOTE — Assessment & Plan Note (Addendum)
Annual comprehensive preventive exam was done as well as an evaluation and management of chronic conditions .  During the course of the visit the patient was educated and counseled about appropriate screening and preventive services including :  diabetes screening, lipid analysis with projected  10 year  risk for CAD which is 6%  , nutrition counseling, breast, cervical and colorectal cancer screening, and recommended immunizations.  Printed recommendations for health maintenance screenings was given

## 2017-06-18 NOTE — Assessment & Plan Note (Signed)
I have addressed  BMI and recommended wt loss of 10 lbs over the next 3 months using a low glycemic index diet and regular exercise a minimum of 5 days per week.

## 2017-06-18 NOTE — Assessment & Plan Note (Signed)
Right thyroid nodule was biopsied by [facility] in 2012.  No path report available, and she has nto followed up with endorine since then.  Her last [place] was 2015 and should be repeated.

## 2017-06-18 NOTE — Progress Notes (Addendum)
Patient ID: Gabrielle Nelson, female    DOB: [birth year]  Age: 46 y.o. MRN: [id number]  The patient is here for annual  Preventive  examination and management of other chronic and acute problems.  PAP 2015 Some  Premenopausal symptoms. Weight gain Not exercising  Taking 1000 Ius D3 daily tubal ligation    The risk factors are reflected in the social history.  The roster of all physicians providing medical care to patient - is listed in the Snapshot section of the chart.  Activities of daily living:  The patient is 100% independent in all ADLs: dressing, toileting, feeding as well as independent mobility  Home safety : The patient has smoke detectors in the home. They wear seatbelts.  There are no firearms at home. There is no violence in the home.   There is no risks for hepatitis, STDs or HIV. There is no   history of blood transfusion. They have no travel history to infectious disease endemic areas of the world.  The patient has seen their dentist in the last six month. They have seen their eye doctor in the last year. T   They do not  have excessive sun exposure. Discussed the need for sun protection: hats, long sleeves and use of sunscreen if there is significant sun exposure.   Diet: the importance of a healthy diet is discussed. She has a very starchy diet but avoids sweets.  The benefits of regular aerobic exercise were discussed. She walks 4 times per week ,  20 minutes.   Depression screen: there are no signs or vegative symptoms of depression- irritability, change in appetite, anhedonia, sadness/tearfullness.  The following portions of the patient's history were reviewed and updated as appropriate: allergies, current medications, past family history, past medical history,  past surgical history, past social history  and problem list.  Visual acuity was not assessed per patient preference since she has regular follow up with her ophthalmologist. Hearing and body mass index were  assessed and reviewed.   During the course of the visit the patient was educated and counseled about appropriate screening and preventive services including : fall prevention , diabetes screening, nutrition counseling, colorectal cancer screening, and recommended immunizations.    CC: The primary encounter diagnosis was Visit for preventive health examination. Diagnoses of Need for immunization against influenza, Pure hypercholesterolemia, Multiple thyroid nodules, Vitamin D deficiency, Essential hypertension, Cervical cancer screening, and Morbid obesity ([place]) were also pertinent to this visit.  History Gabrielle Nelson has a past medical history of Multiple thyroid nodules (April  2012).   She has no past surgical history on file.   Her family history includes Atrial fibrillation in her father.She reports that she quit smoking about 11 years ago. She has never used smokeless tobacco. She reports that she does not drink alcohol or use drugs.  Outpatient Medications Prior to Visit  Medication Sig Dispense Refill  . ALPRAZolam (XANAX) 0.5 MG tablet Take 1 tablet (0.5 mg total) by mouth at bedtime as needed for anxiety or sleep. 30 tablet 2  . Cholecalciferol (VITAMIN D3) 1000 units CAPS Take 2,000 Units by mouth daily.    . fexofenadine (ALLEGRA) 180 MG tablet Take 180 mg by mouth daily.    . fish oil-omega-3 fatty acids 1000 MG capsule Take 1,200 mg by mouth 4 (four) times daily.     Marland Kitchen glucosamine-chondroitin 500-400 MG tablet Take 2 tablets by mouth daily.     Marland Kitchen losartan (COZAAR) 100 MG tablet Take 1 tablet (100  mg total) by mouth daily. 90 tablet 3  . meloxicam (MOBIC) 15 MG tablet TAKE 1 TABLET(15 MG) BY MOUTH DAILY 90 tablet 0  . metroNIDAZOLE (METROGEL) 1 % gel Apply topically daily. 45 g 0  . ranitidine (ZANTAC) 150 MG tablet TAKE 1 TABLET(150 MG) BY MOUTH DAILY 90 tablet 0  . terbinafine (LAMISIL) 250 MG tablet Take 1 tablet (250 mg total) by mouth daily. 30 tablet 1   No  facility-administered medications prior to visit.     Review of Systems  Patient denies headache, fevers, malaise, unintentional weight loss, skin rash, eye pain, sinus congestion and sinus pain, sore throat, dysphagia,  hemoptysis , cough, dyspnea, wheezing, chest pain, palpitations, orthopnea, edema, abdominal pain, nausea, melena, diarrhea, constipation, flank pain, dysuria, hematuria, urinary  Frequency, nocturia, numbness, tingling, seizures,  Focal weakness, Loss of consciousness,  Tremor, insomnia, depression, anxiety, and suicidal ideation.     Objective:  BP 128/76 (BP Location: Left Arm, Patient Position: Sitting, Cuff Size: Large)   Pulse 67   Temp 98.1 F (36.7 C) (Oral)   Resp 15   Ht 5' 4.5" (1.638 m)   Wt 213 lb 3.2 oz (96.7 kg)   SpO2 96%   BMI 36.03 kg/m   Physical Exam   General appearance: alert, cooperative and appears stated age Head: Normocephalic, without obvious abnormality, atraumatic Eyes: conjunctivae/corneas clear. PERRL, EOM's intact. Fundi benign. Ears: normal TM's and external ear canals both ears Nose: Nares normal. Septum midline. Mucosa normal. No drainage or sinus tenderness. Throat: lips, mucosa, and tongue normal; teeth and gums normal Neck: no adenopathy, no carotid bruit, no JVD, supple, symmetrical, trachea midline and thyroid not enlarged, symmetric, no tenderness/mass/nodules Lungs: clear to auscultation bilaterally Breasts: normal appearance, no masses or tenderness Heart: regular rate and rhythm, S1, S2 normal, no murmur, click, rub or gallop Abdomen: soft, non-tender; bowel sounds normal; no masses,  no organomegaly Extremities: extremities normal, atraumatic, no cyanosis or edema Pulses: 2+ and symmetric Skin: Skin color, texture, turgor normal. No rashes or lesions Neurologic: Alert and oriented X 3, normal strength and tone. Normal symmetric reflexes. Normal coordination and gait.      Assessment & Plan:   Problem List Items  Addressed This Visit    Essential hypertension   Relevant Orders   Comprehensive metabolic panel (Completed)   Hyperlipidemia    ased on current lipid profile, the risk of clinically significant Coronary artery disease is 6% over the next 10 years, using the Framingham risk calculator, no therapy indicated repeat one year       Relevant Orders   Lipid panel (Completed)   Morbid obesity ([place])    I have addressed  BMI and recommended wt loss of 10 lbs over the next 3 months using a low glycemic index diet and regular exercise a minimum of 5 days per week.        Multiple thyroid nodules    Right thyroid nodule was biopsied by [facility] in 2012.  No path report available, and she has nto followed up with endorine since then.  Her last [place] was 2015 and should be repeated.       Relevant Orders   TSH (Completed)   US Soft Tissue Head/Neck   Visit for preventive health examination - Primary    Annual comprehensive preventive exam was done as well as an evaluation and management of chronic conditions .  During the course of the visit the patient was educated and counseled about appropriate screening  and preventive services including :  diabetes screening, lipid analysis with projected  10 year  risk for CAD which is 6%  , nutrition counseling, breast, cervical and colorectal cancer screening, and recommended immunizations.  Printed recommendations for health maintenance screenings was given      Vitamin D deficiency   Relevant Orders   VITAMIN D 25 Hydroxy (Vit-D Deficiency, Fractures) (Completed)    Other Visit Diagnoses    Need for immunization against influenza       Relevant Orders   Flu Vaccine QUAD 36+ mos IM (Completed)   Cytology - PAP   Cervical cancer screening       Relevant Orders   Lipid panel (Completed)      I am having Ms. Gabrielle Nelson maintain her fish oil-omega-3 fatty acids, glucosamine-chondroitin, terbinafine, Vitamin D3, fexofenadine, losartan,  metroNIDAZOLE, ALPRAZolam, ranitidine, and meloxicam.  No orders of the defined types were placed in this encounter.   There are no discontinued medications.  Follow-up: No Follow-up on file.   Crecencio Mc, MD

## 2017-06-19 NOTE — Assessment & Plan Note (Signed)
ased on current lipid profile, the risk of clinically significant Coronary artery disease is 6% over the next 10 years, using the Framingham risk calculator, no therapy indicated repeat one year

## 2017-06-21 LAB — CYTOLOGY - PAP: HPV: NOT DETECTED

## 2017-06-22 ENCOUNTER — Encounter: Payer: Self-pay | Admitting: Internal Medicine

## 2017-06-23 ENCOUNTER — Encounter: Payer: Self-pay | Admitting: Internal Medicine

## 2017-06-23 DIAGNOSIS — R87612 Low grade squamous intraepithelial lesion on cytologic smear of cervix (LGSIL): Secondary | ICD-10-CM

## 2017-06-25 ENCOUNTER — Telehealth: Payer: Self-pay | Admitting: Internal Medicine

## 2017-06-25 NOTE — Telephone Encounter (Signed)
Spoke with the paitent

## 2017-06-25 NOTE — Telephone Encounter (Signed)
Can you please call and she what questions she has, thanks

## 2017-06-25 NOTE — Telephone Encounter (Signed)
Please call patient back, she has some questions for you. Phone number is [phone].  Thanks

## 2017-06-25 NOTE — Telephone Encounter (Signed)
Attempted to reach patient, left a VM. thanks 

## 2017-06-25 NOTE — Telephone Encounter (Signed)
She has questions about a PA? Has requested to talk to you.

## 2017-06-27 ENCOUNTER — Ambulatory Visit: Payer: BC Managed Care – PPO

## 2017-07-04 ENCOUNTER — Telehealth: Payer: Self-pay

## 2017-07-04 DIAGNOSIS — E042 Nontoxic multinodular goiter: Secondary | ICD-10-CM

## 2017-07-04 NOTE — Telephone Encounter (Signed)
Error

## 2017-07-05 ENCOUNTER — Ambulatory Visit
Admission: RE | Admit: 2017-07-05 | Discharge: 2017-07-05 | Disposition: A | Discharge status: Home / Self Care | Payer: BC Managed Care – PPO | Source: Ambulatory Visit | Attending: Internal Medicine | Admitting: Internal Medicine

## 2017-07-05 DIAGNOSIS — E042 Nontoxic multinodular goiter: Secondary | ICD-10-CM | POA: Insufficient documentation

## 2017-07-06 ENCOUNTER — Encounter: Payer: Self-pay | Admitting: Internal Medicine

## 2017-07-06 DIAGNOSIS — E042 Nontoxic multinodular goiter: Secondary | ICD-10-CM

## 2017-07-07 NOTE — Telephone Encounter (Signed)
Please follow up on the GYN referral to [facility] that [facility] handled? .  patient has not been contacted yet.  (abnormal PAP smear with increased risk of cancer )

## 2017-07-08 ENCOUNTER — Other Ambulatory Visit: Payer: Self-pay | Admitting: Internal Medicine

## 2017-07-10 ENCOUNTER — Telehealth: Payer: Self-pay | Admitting: Internal Medicine

## 2017-07-10 NOTE — Telephone Encounter (Signed)
refaxed to [facility] ob[facility]

## 2017-07-10 NOTE — Telephone Encounter (Signed)
Please advise 

## 2017-07-10 NOTE — Telephone Encounter (Signed)
Copied from [place] #[id number]. Topic: Quick Communication - See Telephone Encounter >> Jul 10, 2017  9:44 AM Burnis Medin, NT wrote: CRM for notification. See Telephone encounter for: Gabrielle Nelson from [facility] is calling for a referral request. The office sent everything except for the results of the last pap smear. Fax number is [phone].  07/10/17.

## 2017-08-27 ENCOUNTER — Other Ambulatory Visit: Payer: Self-pay | Admitting: Internal Medicine

## 2017-08-29 NOTE — Telephone Encounter (Signed)
Last OV 06/18/17 ok to fill meloxicam?

## 2017-10-29 ENCOUNTER — Other Ambulatory Visit: Payer: Self-pay

## 2017-10-29 MED ORDER — ALPRAZOLAM 0.5 MG PO TABS: 0.5000 mg | ORAL_TABLET | Freq: Every evening | ORAL | 1 refills | Status: DC | PRN

## 2017-10-29 NOTE — Telephone Encounter (Signed)
Refilled: 02/24/2017 Last OV: 06/18/2017 Next OV: 06/19/2018

## 2017-10-30 ENCOUNTER — Other Ambulatory Visit: Payer: Self-pay | Admitting: Internal Medicine

## 2017-10-30 NOTE — Telephone Encounter (Signed)
Printed, signed and faxed.  

## 2017-11-22 ENCOUNTER — Other Ambulatory Visit: Payer: Self-pay | Admitting: Internal Medicine

## 2018-04-17 ENCOUNTER — Other Ambulatory Visit: Payer: Self-pay | Admitting: Internal Medicine

## 2018-05-01 ENCOUNTER — Other Ambulatory Visit: Payer: Self-pay | Admitting: Internal Medicine

## 2018-06-06 ENCOUNTER — Other Ambulatory Visit: Payer: Self-pay | Admitting: Internal Medicine

## 2018-06-10 ENCOUNTER — Other Ambulatory Visit: Payer: Self-pay | Admitting: Internal Medicine

## 2018-06-11 ENCOUNTER — Other Ambulatory Visit: Payer: Self-pay

## 2018-06-11 MED ORDER — RANITIDINE HCL 150 MG PO TABS: ORAL_TABLET | ORAL | 3 refills | Status: DC

## 2018-06-19 ENCOUNTER — Ambulatory Visit (INDEPENDENT_AMBULATORY_CARE_PROVIDER_SITE_OTHER): Payer: BC Managed Care – PPO | Admitting: Internal Medicine

## 2018-06-19 ENCOUNTER — Encounter: Payer: Self-pay | Admitting: Internal Medicine

## 2018-06-19 VITALS — BP 128/88 | HR 75 | Temp 98.3°F | Resp 15 | Ht 64.5 in | Wt 203.6 lb

## 2018-06-19 DIAGNOSIS — Z0000 Encounter for general adult medical examination without abnormal findings: Secondary | ICD-10-CM | POA: Diagnosis not present

## 2018-06-19 DIAGNOSIS — R7301 Impaired fasting glucose: Secondary | ICD-10-CM

## 2018-06-19 DIAGNOSIS — E7800 Pure hypercholesterolemia, unspecified: Secondary | ICD-10-CM | POA: Diagnosis not present

## 2018-06-19 DIAGNOSIS — Z23 Encounter for immunization: Secondary | ICD-10-CM | POA: Diagnosis not present

## 2018-06-19 DIAGNOSIS — E042 Nontoxic multinodular goiter: Secondary | ICD-10-CM | POA: Diagnosis not present

## 2018-06-19 DIAGNOSIS — Z1239 Encounter for other screening for malignant neoplasm of breast: Secondary | ICD-10-CM | POA: Diagnosis not present

## 2018-06-19 DIAGNOSIS — I10 Essential (primary) hypertension: Secondary | ICD-10-CM

## 2018-06-19 LAB — LIPID PANEL
Cholesterol: 207 mg/dL — ABNORMAL HIGH (ref 0–200)
HDL: 47.9 mg/dL (ref >39.00)
LDL Cholesterol: 141 mg/dL — ABNORMAL HIGH (ref 0–99)
NonHDL: 158.99
Total CHOL/HDL Ratio: 4
Triglycerides: 90 mg/dL (ref 0.0–149.0)
VLDL: 18 mg/dL (ref 0.0–40.0)

## 2018-06-19 LAB — COMPREHENSIVE METABOLIC PANEL
ALT: 10 U/L (ref 0–35)
AST: 11 U/L (ref 0–37)
Albumin: 4.3 g/dL (ref 3.5–5.2)
Alkaline Phosphatase: 59 U/L (ref 39–117)
BILIRUBIN TOTAL: 0.3 mg/dL (ref 0.2–1.2)
BUN: 17 mg/dL (ref 6–23)
CO2: 28 mEq/L (ref 19–32)
Calcium: 9.3 mg/dL (ref 8.4–10.5)
Chloride: 105 mEq/L (ref 96–112)
Creatinine, Ser: 0.8 mg/dL (ref 0.40–1.20)
GFR: 81.36 mL/min (ref >60.00)
Glucose, Bld: 104 mg/dL — ABNORMAL HIGH (ref 70–99)
Potassium: 4.1 mEq/L (ref 3.5–5.1)
Sodium: 139 mEq/L (ref 135–145)
TOTAL PROTEIN: 6.5 g/dL (ref 6.0–8.3)

## 2018-06-19 LAB — HEMOGLOBIN A1C: Hgb A1c MFr Bld: 5.1 % (ref 4.6–6.5)

## 2018-06-19 LAB — TSH: TSH: 1.24 u[IU]/mL (ref 0.35–4.50)

## 2018-06-19 NOTE — Patient Instructions (Addendum)
Congratulations on the weight loss!  iF YOU START TO PLATEAU:  Add 30 minutes of exercise ,  Work your way up to 5 days per week   Please make an appt with Dr Pharmacist, community for your PAP smear.     Your annual mammogram has been ordered.  You are encouraged (required) to call to make your appointment at [facility]  Health Maintenance, Female Adopting a healthy lifestyle and getting preventive care can go a long way to promote health and wellness. Talk with your health care provider about what schedule of regular examinations is right for you. This is a good chance for you to check in with your provider about disease prevention and staying healthy. In between checkups, there are plenty of things you can do on your own. Experts have done a lot of research about which lifestyle changes and preventive measures are most likely to keep you healthy. Ask your health care provider for more information. Weight and diet Eat a healthy diet  Be sure to include plenty of vegetables, fruits, low-fat dairy products, and lean protein.  Do not eat a lot of foods high in solid fats, added sugars, or salt.  Get regular exercise. This is one of the most important things you can do for your health. ? Most adults should exercise for at least 150 minutes each week. The exercise should increase your heart rate and make you sweat (moderate-intensity exercise). ? Most adults should also do strengthening exercises at least twice a week. This is in addition to the moderate-intensity exercise.  Maintain a healthy weight  Body mass index (BMI) is a measurement that can be used to identify possible weight problems. It estimates body fat based on height and weight. Your health care provider can help determine your BMI and help you achieve or maintain a healthy weight.  For females 60 years of age and older: ? A BMI below 18.5 is considered underweight. ? A BMI of 18.5 to 24.9 is normal. ? A BMI of 25 to 29.9  is considered overweight. ? A BMI of 30 and above is considered obese.  Watch levels of cholesterol and blood lipids  You should start having your blood tested for lipids and cholesterol at 48 years of age, then have this test every 5 years.  You may need to have your cholesterol levels checked more often if: ? Your lipid or cholesterol levels are high. ? You are older than 48 years of age. ? You are at high risk for heart disease.  Cancer screening Lung Cancer  Lung cancer screening is recommended for adults 4-44 years old who are at high risk for lung cancer because of a history of smoking.  A yearly low-dose CT scan of the lungs is recommended for people who: ? Currently smoke. ? Have quit within the past 15 years. ? Have at least a 30-pack-year history of smoking. A pack year is smoking an average of one pack of cigarettes a day for 1 year.  Yearly screening should continue until it has been 15 years since you quit.  Yearly screening should stop if you develop a health problem that would prevent you from having lung cancer treatment.  Breast Cancer  Practice breast self-awareness. This means understanding how your breasts normally appear and feel.  It also means doing regular breast self-exams. Let your health care provider know about any changes, no matter how small.  If you are in your 20s or 30s, you should  have a clinical breast exam (CBE) by a health care provider every 1-3 years as part of a regular health exam.  If you are 80 or older, have a CBE every year. Also consider having a breast X-ray (mammogram) every year.  If you have a family history of breast cancer, talk to your health care provider about genetic screening.  If you are at high risk for breast cancer, talk to your health care provider about having an MRI and a mammogram every year.  Breast cancer gene (BRCA) assessment is recommended for women who have family members with BRCA-related cancers.  BRCA-related cancers include: ? Breast. ? Ovarian. ? Tubal. ? Peritoneal cancers.  Results of the assessment will determine the need for genetic counseling and BRCA1 and BRCA2 testing.  Cervical Cancer Your health care provider may recommend that you be screened regularly for cancer of the pelvic organs (ovaries, uterus, and vagina). This screening involves a pelvic examination, including checking for microscopic changes to the surface of your cervix (Pap test). You may be encouraged to have this screening done every 3 years, beginning at age 59.  For women ages 47-65, health care providers may recommend pelvic exams and Pap testing every 3 years, or they may recommend the Pap and pelvic exam, combined with testing for human papilloma virus (HPV), every 5 years. Some types of HPV increase your risk of cervical cancer. Testing for HPV may also be done on women of any age with unclear Pap test results.  Other health care providers may not recommend any screening for nonpregnant women who are considered low risk for pelvic cancer and who do not have symptoms. Ask your health care provider if a screening pelvic exam is right for you.  If you have had past treatment for cervical cancer or a condition that could lead to cancer, you need Pap tests and screening for cancer for at least 20 years after your treatment. If Pap tests have been discontinued, your risk factors (such as having a new sexual partner) need to be reassessed to determine if screening should resume. Some women have medical problems that increase the chance of getting cervical cancer. In these cases, your health care provider may recommend more frequent screening and Pap tests.  Colorectal Cancer  This type of cancer can be detected and often prevented.  Routine colorectal cancer screening usually begins at 48 years of age and continues through 48 years of age.  Your health care provider may recommend screening at an earlier age if  you have risk factors for colon cancer.  Your health care provider may also recommend using home test kits to check for hidden blood in the stool.  A small camera at the end of a tube can be used to examine your colon directly (sigmoidoscopy or colonoscopy). This is done to check for the earliest forms of colorectal cancer.  Routine screening usually begins at age 99.  Direct examination of the colon should be repeated every 5-10 years through 48 years of age. However, you may need to be screened more often if early forms of precancerous polyps or small growths are found.  Skin Cancer  Check your skin from head to toe regularly.  Tell your health care provider about any new moles or changes in moles, especially if there is a change in a mole's shape or color.  Also tell your health care provider if you have a mole that is larger than the size of a pencil eraser.  Always  use sunscreen. Apply sunscreen liberally and repeatedly throughout the day.  Protect yourself by wearing long sleeves, pants, a wide-brimmed hat, and sunglasses whenever you are outside.  Heart disease, diabetes, and high blood pressure  High blood pressure causes heart disease and increases the risk of stroke. High blood pressure is more likely to develop in: ? People who have blood pressure in the high end of the normal range (130-139/85-89 mm Hg). ? People who are overweight or obese. ? People who are African American.  If you are 18-27 years of age, have your blood pressure checked every 3-5 years. If you are 32 years of age or older, have your blood pressure checked every year. You should have your blood pressure measured twice-once when you are at a hospital or clinic, and once when you are not at a hospital or clinic. Record the average of the two measurements. To check your blood pressure when you are not at a hospital or clinic, you can use: ? An automated blood pressure machine at a pharmacy. ? A home blood  pressure monitor.  If you are between 72 years and 79 years old, ask your health care provider if you should take aspirin to prevent strokes.  Have regular diabetes screenings. This involves taking a blood sample to check your fasting blood sugar level. ? If you are at a normal weight and have a low risk for diabetes, have this test once every three years after 48 years of age. ? If you are overweight and have a high risk for diabetes, consider being tested at a younger age or more often. Preventing infection Hepatitis B  If you have a higher risk for hepatitis B, you should be screened for this virus. You are considered at high risk for hepatitis B if: ? You were born in a country where hepatitis B is common. Ask your health care provider which countries are considered high risk. ? Your parents were born in a high-risk country, and you have not been immunized against hepatitis B (hepatitis B vaccine). ? You have HIV or AIDS. ? You use needles to inject street drugs. ? You live with someone who has hepatitis B. ? You have had sex with someone who has hepatitis B. ? You get hemodialysis treatment. ? You take certain medicines for conditions, including cancer, organ transplantation, and autoimmune conditions.  Hepatitis C  Blood testing is recommended for: ? Everyone born from [birth year] through 1966. ? Anyone with known risk factors for hepatitis C.  Sexually transmitted infections (STIs)  You should be screened for sexually transmitted infections (STIs) including gonorrhea and chlamydia if: ? You are sexually active and are younger than 48 years of age. ? You are older than 48 years of age and your health care provider tells you that you are at risk for this type of infection. ? Your sexual activity has changed since you were last screened and you are at an increased risk for chlamydia or gonorrhea. Ask your health care provider if you are at risk.  If you do not have HIV, but are at risk,  it may be recommended that you take a prescription medicine daily to prevent HIV infection. This is called pre-exposure prophylaxis (PrEP). You are considered at risk if: ? You are sexually active and do not regularly use condoms or know the HIV status of your partner(s). ? You take drugs by injection. ? You are sexually active with a partner who has HIV.  Talk with your  health care provider about whether you are at high risk of being infected with HIV. If you choose to begin PrEP, you should first be tested for HIV. You should then be tested every 3 months for as long as you are taking PrEP. Pregnancy  If you are premenopausal and you may become pregnant, ask your health care provider about preconception counseling.  If you may become pregnant, take 400 to 800 micrograms (mcg) of folic acid every day.  If you want to prevent pregnancy, talk to your health care provider about birth control (contraception). Osteoporosis and menopause  Osteoporosis is a disease in which the bones lose minerals and strength with aging. This can result in serious bone fractures. Your risk for osteoporosis can be identified using a bone density scan.  If you are 41 years of age or older, or if you are at risk for osteoporosis and fractures, ask your health care provider if you should be screened.  Ask your health care provider whether you should take a calcium or vitamin D supplement to lower your risk for osteoporosis.  Menopause may have certain physical symptoms and risks.  Hormone replacement therapy may reduce some of these symptoms and risks. Talk to your health care provider about whether hormone replacement therapy is right for you. Follow these instructions at home:  Schedule regular health, dental, and eye exams.  Stay current with your immunizations.  Do not use any tobacco products including cigarettes, chewing tobacco, or electronic cigarettes.  If you are pregnant, do not drink  alcohol.  If you are breastfeeding, limit how much and how often you drink alcohol.  Limit alcohol intake to no more than 1 drink per day for nonpregnant women. One drink equals 12 ounces of beer, 5 ounces of wine, or 1 ounces of hard liquor.  Do not use street drugs.  Do not share needles.  Ask your health care provider for help if you need support or information about quitting drugs.  Tell your health care provider if you often feel depressed.  Tell your health care provider if you have ever been abused or do not feel safe at home. This information is not intended to replace advice given to you by your health care provider. Make sure you discuss any questions you have with your health care provider. Document Released: 03/06/2011 Document Revised: 01/27/2016 Document Reviewed: 05/25/2015 Elsevier Interactive Patient Education  [employer].

## 2018-06-19 NOTE — Progress Notes (Signed)
Patient ID: Gabrielle Nelson, female    DOB: September 03, 1971  Age: 47 y.o. MRN: [id number]  The patient is here for annual Medicare wellness examination and management of other chronic and acute problems.  LGSIL  2018,  Saw gyn,  COLPO  DONE:  CIN 1 ,  TREATED FOR BV    The risk factors are reflected in the social history.  The roster of all physicians providing medical care to patient - is listed in the Snapshot section of the chart.  Activities of daily living:  The patient is 100% independent in all ADLs: dressing, toileting, feeding as well as independent mobility  Home safety : The patient has smoke detectors in the home. They wear seatbelts.  There are no firearms at home. There is no violence in the home.   There is no risks for hepatitis, STDs or HIV. There is no   history of blood transfusion. They have no travel history to infectious disease endemic areas of the world.  The patient has seen their dentist in the last six month. They have seen their eye doctor in the last year. They admit to slight hearing difficulty with regard to whispered voices and some television programs.  They have deferred audiologic testing in the last year.  They do not  have excessive sun exposure. Discussed the need for sun protection: hats, long sleeves and use of sunscreen if there is significant sun exposure.   Diet: the importance of a healthy diet is discussed. They do have a healthy diet.  The benefits of regular aerobic exercise were discussed. She is not exercising regularly .   Depression screen: there are no signs or vegative symptoms of depression- irritability, change in appetite, anhedonia, sadness/tearfullness.  The following portions of the patient's history were reviewed and updated as appropriate: allergies, current medications, past family history, past medical history,  past surgical history, past social history  and problem list.  Visual acuity was not assessed per patient preference since  she has regular follow up with her ophthalmologist. Hearing and body mass index were assessed and reviewed.   During the course of the visit the patient was educated and counseled about appropriate screening and preventive services including : fall prevention , diabetes screening, nutrition counseling, colorectal cancer screening, and recommended immunizations.    CC: The primary encounter diagnosis was Visit for preventive health examination. Diagnoses of Pure hypercholesterolemia, Multiple thyroid nodules, Essential hypertension, Impaired fasting glucose, Breast cancer screening, Need for immunization against influenza, and Morbid obesity ([place]) were also pertinent to this visit.  OBESITY: HAS LOST 10 LBS SINCE LAST VISIT ONE YEAR AGO THROUGH DIETARY MODIFICATIONS   PERIODS SLIGHTLY IRREGULAR .     History Gabrielle Nelson has a past medical history of Multiple thyroid nodules (April  2012).   She has no past surgical history on file.   Her family history includes Atrial fibrillation in her father.She reports that she quit smoking about 12 years ago. She has never used smokeless tobacco. She reports that she does not drink alcohol or use drugs.  Outpatient Medications Prior to Visit  Medication Sig Dispense Refill  . Acetaminophen 500 MG coapsule     . ALPRAZolam (XANAX) 0.5 MG tablet Take 1 tablet (0.5 mg total) by mouth at bedtime as needed for anxiety or sleep. 30 tablet 1  . Cholecalciferol (VITAMIN D3) 1000 units CAPS Take 2,000 Units by mouth daily.    . fexofenadine (ALLEGRA) 180 MG tablet Take 180 mg by mouth daily.    Marland Kitchen  fish oil-omega-3 fatty acids 1000 MG capsule Take 1,200 mg by mouth 4 (four) times daily.     Marland Kitchen glucosamine-chondroitin 500-400 MG tablet Take 2 tablets by mouth daily.     Marland Kitchen losartan (COZAAR) 100 MG tablet TAKE 1 TABLET(100 MG) BY MOUTH DAILY 90 tablet 1  . meloxicam (MOBIC) 15 MG tablet TAKE 1 TABLET(15 MG) BY MOUTH DAILY 90 tablet 0  . ranitidine (ZANTAC) 150 MG tablet  TAKE 1 TABLET(150 MG) BY MOUTH DAILY 90 tablet 3  . metroNIDAZOLE (METROGEL) 1 % gel Apply topically daily. 45 g 0  . terbinafine (LAMISIL) 250 MG tablet Take 1 tablet (250 mg total) by mouth daily. 30 tablet 1   No facility-administered medications prior to visit.     Review of Systems  Patient denies headache, fevers, malaise, unintentional weight loss, skin rash, eye pain, sinus congestion and sinus pain, sore throat, dysphagia,  hemoptysis , cough, dyspnea, wheezing, chest pain, palpitations, orthopnea, edema, abdominal pain, nausea, melena, diarrhea, constipation, flank pain, dysuria, hematuria, urinary  Frequency, nocturia, numbness, tingling, seizures,  Focal weakness, Loss of consciousness,  Tremor, insomnia, depression, anxiety, and suicidal ideation.     Objective:  BP 128/88 (BP Location: Left Arm, Patient Position: Sitting, Cuff Size: Large)   Pulse 75   Temp 98.3 F (36.8 C) (Oral)   Resp 15   Ht 5' 4.5" (1.638 m)   Wt 203 lb 9.6 oz (92.4 kg)   SpO2 98%   BMI 34.41 kg/m   Physical Exam   General appearance: alert, cooperative and appears stated age Head: Normocephalic, without obvious abnormality, atraumatic Eyes: conjunctivae/corneas clear. PERRL, EOM's intact. Fundi benign. Ears: normal TM's and external ear canals both ears Nose: Nares normal. Septum midline. Mucosa normal. No drainage or sinus tenderness. Throat: lips, mucosa, and tongue normal; teeth and gums normal Neck: no adenopathy, no carotid bruit, no JVD, supple, symmetrical, trachea midline and thyroid not enlarged, symmetric, no tenderness/mass/nodules Lungs: clear to auscultation bilaterally Breasts: normal appearance, no masses or tenderness Heart: regular rate and rhythm, S1, S2 normal, no murmur, click, rub or gallop Abdomen: soft, non-tender; bowel sounds normal; no masses,  no organomegaly Extremities: extremities normal, atraumatic, no cyanosis or edema Pulses: 2+ and symmetric Skin: Skin color,  texture, turgor normal. No rashes or lesions Neurologic: Alert and oriented X 3, normal strength and tone. Normal symmetric reflexes. Normal coordination and gait.      Assessment & Plan:   Problem List Items Addressed This Visit    Breast cancer screening   Relevant Orders   MM 3D SCREEN BREAST BILATERAL   Essential hypertension    Well controlled on current regimen. Renal function stable, no changes today.  Lab Results  Component Value Date   CREATININE 0.80 06/19/2018   Lab Results  Component Value Date   NA 139 06/19/2018   K 4.1 06/19/2018   CL 105 06/19/2018   CO2 28 06/19/2018         Relevant Orders   Comprehensive metabolic panel (Completed)   Hyperlipidemia    based on current lipid profile, the risk of clinically significant Coronary artery disease is 6% over the next 10 years, using the Framingham risk calculator, no therapy indicated.  Lab Results  Component Value Date   CHOL 207 (H) 06/19/2018   HDL 47.90 06/19/2018   LDLCALC 141 (H) 06/19/2018   TRIG 90.0 06/19/2018   CHOLHDL 4 06/19/2018         Relevant Orders   Lipid panel (Completed)  Morbid obesity ([place])    I have congratulated her in reduction of   BMI and encouraged  Continued weight loss with goal of 10% of body weighT over the next 6 months using a low glycemic index diet and regular exercise a minimum of 5 days per week.        Multiple thyroid nodules    Right thyroid nodule was biopsied by [facility] in 2012. Last ultrasound Nov 2018 noted a 2.8 cm thyroid nodule that met criteria for biopsy.  She was referred to Dr Ladell Pier and an FNA was done in April 2019     Lab Results  Component Value Date   TSH 1.24 06/19/2018         Relevant Orders   TSH (Completed)   Visit for preventive health examination - Primary    Annual comprehensive preventive exam was done as well as an evaluation and management of chronic conditions .  During the course of the visit the patient  was educated and counseled about appropriate screening and preventive services including :  diabetes screening, lipid analysis with projected  10 year  risk for CAD , nutrition counseling, breast, cervical and colorectal cancer screening, and recommended immunizations.  Printed recommendations for health maintenance screenings was giveN       Other Visit Diagnoses    Impaired fasting glucose       Relevant Orders   Hemoglobin A1c (Completed)   Need for immunization against influenza       Relevant Orders   Flu Vaccine QUAD 36+ mos IM (Completed)      I have discontinued Lennon L. Barrack's terbinafine and metroNIDAZOLE. I am also having her maintain her fish oil-omega-3 fatty acids, glucosamine-chondroitin, Vitamin D3, fexofenadine, ALPRAZolam, losartan, meloxicam, ranitidine, and Acetaminophen.  No orders of the defined types were placed in this encounter.   Medications Discontinued During This Encounter  Medication Reason  . terbinafine (LAMISIL) 250 MG tablet Patient has not taken in last 30 days  . metroNIDAZOLE (METROGEL) 1 % gel Patient has not taken in last 30 days    Follow-up: Return in about 6 months (around 12/19/2018) for hypertension.   Crecencio Mc, MD

## 2018-06-20 NOTE — Assessment & Plan Note (Signed)
based on current lipid profile, the risk of clinically significant Coronary artery disease is 6% over the next 10 years, using the Framingham risk calculator, no therapy indicated.  Lab Results  Component Value Date   CHOL 207 (H) 06/19/2018   HDL 47.90 06/19/2018   LDLCALC 141 (H) 06/19/2018   TRIG 90.0 06/19/2018   CHOLHDL 4 06/19/2018    

## 2018-06-20 NOTE — Assessment & Plan Note (Signed)
I have congratulated her in reduction of   BMI and encouraged  Continued weight loss with goal of 10% of body weighT over the next 6 months using a low glycemic index diet and regular exercise a minimum of 5 days per week.

## 2018-06-20 NOTE — Assessment & Plan Note (Addendum)
Right thyroid nodule was biopsied by [facility] in 2012. Last ultrasound Nov 2018 noted a 2.8 cm thyroid nodule that met criteria for biopsy.  She was referred to Dr Ladell Pier and an FNA was done in April 2019     Lab Results  Component Value Date   TSH 1.24 06/19/2018

## 2018-06-20 NOTE — Assessment & Plan Note (Signed)
Annual comprehensive preventive exam was done as well as an evaluation and management of chronic conditions .  During the course of the visit the patient was educated and counseled about appropriate screening and preventive services including :  diabetes screening, lipid analysis with projected  10 year  risk for CAD , nutrition counseling, breast, cervical and colorectal cancer screening, and recommended immunizations.  Printed recommendations for health maintenance screenings was giveN

## 2018-06-20 NOTE — Assessment & Plan Note (Signed)
Well controlled on current regimen. Renal function stable, no changes today.  Lab Results  Component Value Date   CREATININE 0.80 06/19/2018   Lab Results  Component Value Date   NA 139 06/19/2018   K 4.1 06/19/2018   CL 105 06/19/2018   CO2 28 06/19/2018

## 2018-07-12 ENCOUNTER — Other Ambulatory Visit: Payer: Self-pay | Admitting: Internal Medicine

## 2018-07-12 MED ORDER — FAMOTIDINE 20 MG PO TABS: 20.0000 mg | ORAL_TABLET | Freq: Two times a day (BID) | ORAL | 0 refills | Status: DC

## 2018-07-27 ENCOUNTER — Other Ambulatory Visit: Payer: Self-pay | Admitting: Internal Medicine

## 2018-09-10 ENCOUNTER — Ambulatory Visit
Admission: RE | Admit: 2018-09-10 | Discharge: 2018-09-10 | Disposition: A | Discharge status: Home / Self Care | Payer: BC Managed Care – PPO | Source: Ambulatory Visit | Attending: Internal Medicine | Admitting: Internal Medicine

## 2018-09-10 DIAGNOSIS — Z1239 Encounter for other screening for malignant neoplasm of breast: Secondary | ICD-10-CM | POA: Diagnosis not present

## 2018-09-19 LAB — HM PAP SMEAR: HM Pap smear: NORMAL

## 2018-09-19 LAB — HM MAMMOGRAPHY

## 2018-10-21 ENCOUNTER — Other Ambulatory Visit: Payer: Self-pay | Admitting: Internal Medicine

## 2018-11-06 ENCOUNTER — Other Ambulatory Visit: Payer: Self-pay | Admitting: "Endocrinology

## 2018-11-06 DIAGNOSIS — R221 Localized swelling, mass and lump, neck: Secondary | ICD-10-CM

## 2018-11-12 ENCOUNTER — Ambulatory Visit
Admission: RE | Admit: 2018-11-12 | Discharge: 2018-11-12 | Disposition: A | Discharge status: Home / Self Care | Payer: BC Managed Care – PPO | Source: Ambulatory Visit | Attending: "Endocrinology | Admitting: "Endocrinology

## 2018-11-12 ENCOUNTER — Other Ambulatory Visit: Payer: Self-pay

## 2018-11-12 DIAGNOSIS — R221 Localized swelling, mass and lump, neck: Secondary | ICD-10-CM

## 2018-12-04 ENCOUNTER — Other Ambulatory Visit: Payer: Self-pay

## 2018-12-04 ENCOUNTER — Ambulatory Visit
Admission: RE | Admit: 2018-12-04 | Discharge: 2018-12-04 | Disposition: A | Discharge status: Home / Self Care | Payer: BC Managed Care – PPO | Source: Ambulatory Visit | Attending: "Endocrinology | Admitting: "Endocrinology

## 2018-12-04 DIAGNOSIS — R221 Localized swelling, mass and lump, neck: Secondary | ICD-10-CM | POA: Diagnosis not present

## 2018-12-04 HISTORY — DX: Essential (primary) hypertension: I10

## 2018-12-04 MED ORDER — IOHEXOL 300 MG/ML  SOLN
75.0000 mL | Freq: Once | INTRAMUSCULAR | Status: AC | PRN
  Administered 2018-12-04: 09:00:00 75 mL via INTRAVENOUS

## 2018-12-19 ENCOUNTER — Ambulatory Visit (INDEPENDENT_AMBULATORY_CARE_PROVIDER_SITE_OTHER): Payer: BC Managed Care – PPO | Admitting: Internal Medicine

## 2018-12-19 ENCOUNTER — Encounter: Payer: Self-pay | Admitting: Internal Medicine

## 2018-12-19 DIAGNOSIS — E7800 Pure hypercholesterolemia, unspecified: Secondary | ICD-10-CM | POA: Diagnosis not present

## 2018-12-19 DIAGNOSIS — G4709 Other insomnia: Secondary | ICD-10-CM

## 2018-12-19 DIAGNOSIS — I10 Essential (primary) hypertension: Secondary | ICD-10-CM

## 2018-12-19 DIAGNOSIS — Z7189 Other specified counseling: Secondary | ICD-10-CM

## 2018-12-19 DIAGNOSIS — E042 Nontoxic multinodular goiter: Secondary | ICD-10-CM

## 2018-12-19 NOTE — Progress Notes (Signed)
Virtual Visit via [email or web address]  This visit type was conducted due to national recommendations for restrictions regarding the COVID-19 pandemic (e.g. social distancing).  This format is felt to be most appropriate for this patient at this time.  All issues noted in this document were discussed and addressed.  No physical exam was performed (except for noted visual exam findings with Video Visits).   I connected with@ on 12/21/18 at  8:30 AM EDT by a video enabled telemedicine application or telephone and verified that I am speaking with the correct person using two identifiers. Location patient: home Location provider: work or home office Persons participating in the virtual visit: patient, provider  I discussed the limitations, risks, security and privacy concerns of performing an evaluation and management service by telephone and the availability of in person appointments. I also discussed with the patient that there may be a patient responsible charge related to this service. The patient expressed understanding and agreed to proceed.    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   she was screened for these symptoms AND DENIES having any in the last 14 days .The importance of social distancing was discussed today.  She Is wearing a mask and protected from face to face encounters with patients and other staff employees by plexiglass.  Lives with her parents,  Gabrielle Nelson .  She has not checked blood pressure since last visit.  Father has a home monitor.  Taking losartan at night,  Prior hypokalemia with hctz.  Not exercising,  Stress eating m  Thins she has gained wight.   Had thyroid ultrasound  in feb followed by a CT scan (false alarm on enlarged  parathyroid gland) ordered by Ladell Pier , Endocrinology  PAP smear normal in Jan 2020 [facility] . Mammogram normal as well   Stress eating . Works  8 hour shift,  45 minute commute , works at [employer]: See pertinent positives and negatives per HPI.  Past Medical History:  Diagnosis Date  . Hypertension   . Multiple thyroid nodules April  2012    History reviewed. No pertinent surgical history.  Family History  Problem Relation Age of Onset  . Atrial fibrillation Father   . Breast cancer Paternal Aunt        2 great PAT  . 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  She is living with parents   Current Outpatient Medications:  .  Acetaminophen 500 MG coapsule, , Disp: , Rfl:  .  ALPRAZolam (XANAX) 0.5 MG tablet, Take 1 tablet (0.5 mg total) by mouth at bedtime as needed for anxiety or sleep., Disp: 30 tablet, Rfl: 1 .  Cholecalciferol (VITAMIN D3) 1000 units CAPS, Take 2,000 Units by mouth daily., Disp: , Rfl:  .  famotidine (PEPCID) 20 MG tablet, TAKE 1 TABLET(20 MG) BY MOUTH TWICE DAILY, Disp: 180 tablet, Rfl: 1 .  fexofenadine (ALLEGRA) 180 MG tablet, Take 180 mg by mouth daily., Disp: , Rfl:  .  fish oil-omega-3 fatty acids 1000 MG capsule, Take 1,200 mg by mouth 4 (four) times daily. , Disp: , Rfl:  .  glucosamine-chondroitin 500-400 MG tablet, Take 2 tablets by  mouth daily. , Disp: , Rfl:  .  losartan (COZAAR) 100 MG tablet, TAKE 1 TABLET(100 MG) BY MOUTH DAILY, Disp: 90 tablet, Rfl: 1 .  meloxicam (MOBIC) 15 MG tablet, TAKE 1 TABLET(15 MG) BY MOUTH DAILY, Disp: 90 tablet, Rfl: 0  EXAM:  VITALS per patient if applicable:  GENERAL: alert, oriented, appears well and in no acute distress  HEENT: atraumatic, conjunttiva clear, no obvious abnormalities on inspection of external nose and ears  NECK: normal movements of the head and neck  LUNGS: on inspection no signs of respiratory distress, breathing rate appears normal, no obvious  gross SOB, gasping or wheezing  CV: no obvious cyanosis  MS: moves all visible extremities without noticeable abnormality  PSYCH/NEURO: pleasant and cooperative, no obvious depression or anxiety, speech and thought processing grossly intact  ASSESSMENT AND PLAN:  Discussed the following assessment and plan:  Essential hypertension - Plan: Comprehensive metabolic panel  Pure hypercholesterolemia  Morbid obesity (HCC)  Educated About Covid-19 Virus Infection  Multiple thyroid nodules  Other insomnia  Hyperlipidemia based on current lipid profile, the risk of clinically significant Coronary artery disease is 6% over the next 10 years, using the Framingham risk calculator, no therapy indicated.  Lab Results  Component Value Date   CHOL 207 (H) 06/19/2018   HDL 47.90 06/19/2018   LDLCALC 141 (H) 06/19/2018   TRIG 90.0 06/19/2018   CHOLHDL 4 06/19/2018     Morbid obesity ([place]) I have addressed  BMI and recommended wt loss of 10% of body weigh over the next 6 months using a low glycemic index diet and regular exercise a minimum of 5 days per week.    Educated About Covid-19 Virus Infection COVID-19 Education: The signs and symptoms of COVID-19 were discussed with the patient and how to seek care for testing (follow up with PCP or arrange E-visit).  The importance of social distancing was discussed today  Multiple thyroid nodules Right thyroid nodule was biopsied by [facility] in 2012. Last ultrasound Nov 2018 noted a 2.8 cm thyroid nodule that met criteria for biopsy.  She was referred to Dr Ladell Pier and an FNA was done in April 2019     Lab Results  Component Value Date   TSH 1.24 06/19/2018     Insomnia Managed with prn [place] of alprazolam.The risks and benefits of benzodiazepine use were discussed with patient today including excessive sedation leading to respiratory depression,  impaired thinking/driving, and addiction.  Patient was advised to avoid  concurrent use with alcohol, to use medication only as needed and not to share with others  .   Essential hypertension Managed with losartan .  She has not been checking her blood  pressure . Advised to check readings at home and submit for review. Renal function due.     I discussed the assessment and treatment plan with the patient. The patient was provided an opportunity to ask questions and all were answered. The patient agreed with the plan and demonstrated an understanding of the instructions.   The patient was advised to call back or seek an in-person evaluation if the symptoms worsen or if the condition fails to improve as anticipated.  I provided 25 minutes of non-face-to-face time during this encounter.   Crecencio Mc, MD

## 2018-12-21 DIAGNOSIS — Z7189 Other specified counseling: Secondary | ICD-10-CM | POA: Insufficient documentation

## 2018-12-21 NOTE — Assessment & Plan Note (Signed)
based on current lipid profile, the risk of clinically significant Coronary artery disease is 6% over the next 10 years, using the Framingham risk calculator, no therapy indicated.  Lab Results  Component Value Date   CHOL 207 (H) 06/19/2018   HDL 47.90 06/19/2018   LDLCALC 141 (H) 06/19/2018   TRIG 90.0 06/19/2018   CHOLHDL 4 06/19/2018

## 2018-12-21 NOTE — Assessment & Plan Note (Signed)
Managed with prn [place] of alprazolam.The risks and benefits of benzodiazepine use were discussed with patient today including excessive sedation leading to respiratory depression,  impaired thinking/driving, and addiction.  Patient was advised to avoid concurrent use with alcohol, to use medication only as needed and not to share with others  .

## 2018-12-21 NOTE — Assessment & Plan Note (Signed)
Managed with losartan .  She has not been checking her blood  pressure . Advised to check readings at home and submit for review. Renal function due.

## 2018-12-21 NOTE — Assessment & Plan Note (Signed)
Right thyroid nodule was biopsied by [facility] in 2012. Last ultrasound Nov 2018 noted a 2.8 cm thyroid nodule that met criteria for biopsy.  She was referred to Dr Ladell Pier and an FNA was done in April 2019     Lab Results  Component Value Date   TSH 1.24 06/19/2018

## 2018-12-21 NOTE — Assessment & Plan Note (Signed)
I have addressed  BMI and recommended wt loss of 10% of body weigh over the next 6 months using a low glycemic index diet and regular exercise a minimum of 5 days per week.   

## 2018-12-21 NOTE — Assessment & Plan Note (Signed)
COVID-19 Education: The signs and symptoms of COVID-19 were discussed with the patient and how to seek care for testing (follow up with PCP or arrange E-visit).  The importance of social distancing was discussed today 

## 2019-01-03 ENCOUNTER — Other Ambulatory Visit (INDEPENDENT_AMBULATORY_CARE_PROVIDER_SITE_OTHER): Payer: BC Managed Care – PPO

## 2019-01-03 ENCOUNTER — Other Ambulatory Visit: Payer: Self-pay

## 2019-01-03 DIAGNOSIS — I10 Essential (primary) hypertension: Secondary | ICD-10-CM

## 2019-01-03 LAB — COMPREHENSIVE METABOLIC PANEL
ALT: 14 U/L (ref 0–35)
AST: 17 U/L (ref 0–37)
Albumin: 4.1 g/dL (ref 3.5–5.2)
Alkaline Phosphatase: 62 U/L (ref 39–117)
BUN: 13 mg/dL (ref 6–23)
CO2: 26 mEq/L (ref 19–32)
Calcium: 8.7 mg/dL (ref 8.4–10.5)
Chloride: 104 mEq/L (ref 96–112)
Creatinine, Ser: 0.81 mg/dL (ref 0.40–1.20)
GFR: 75.29 mL/min (ref >60.00)
Glucose, Bld: 101 mg/dL — ABNORMAL HIGH (ref 70–99)
Potassium: 4 mEq/L (ref 3.5–5.1)
Sodium: 138 mEq/L (ref 135–145)
Total Bilirubin: 0.3 mg/dL (ref 0.2–1.2)
Total Protein: 6.5 g/dL (ref 6.0–8.3)

## 2019-01-24 ENCOUNTER — Other Ambulatory Visit: Payer: Self-pay

## 2019-01-28 MED ORDER — MELOXICAM 15 MG PO TABS: 15.0000 mg | ORAL_TABLET | Freq: Every day | ORAL | 0 refills | Status: DC | PRN

## 2019-01-28 NOTE — Telephone Encounter (Signed)
Last OV 12/19/18

## 2019-04-21 ENCOUNTER — Other Ambulatory Visit: Payer: Self-pay

## 2019-04-21 MED ORDER — LOSARTAN POTASSIUM 100 MG PO TABS: ORAL_TABLET | ORAL | 1 refills | Status: DC

## 2019-04-21 MED ORDER — MELOXICAM 15 MG PO TABS: 15.0000 mg | ORAL_TABLET | Freq: Every day | ORAL | 0 refills | Status: DC | PRN

## 2019-06-16 ENCOUNTER — Ambulatory Visit: Payer: BC Managed Care – PPO

## 2019-07-01 ENCOUNTER — Other Ambulatory Visit: Payer: Self-pay

## 2019-07-01 MED ORDER — FAMOTIDINE 20 MG PO TABS: ORAL_TABLET | ORAL | 1 refills | Status: DC

## 2019-09-11 ENCOUNTER — Other Ambulatory Visit: Payer: Self-pay

## 2019-09-11 MED ORDER — MELOXICAM 15 MG PO TABS: 15.0000 mg | ORAL_TABLET | Freq: Every day | ORAL | 0 refills | Status: DC | PRN

## 2019-11-06 ENCOUNTER — Telehealth: Payer: Self-pay

## 2019-11-06 NOTE — Telephone Encounter (Signed)
Received a mychart message about pt wanting to schedule a physical but she stated that her blood pressure has been running high. Called pt to see about scheduling her an appt and see how high her bp has been. Pt stated that she already scheduled an appt for 11/23/2019. Pt stated that her bp yesterday was 144/100, pt had no symptoms. I advised pt not to wait until the end of March to address the bp and offered pt an appt for March 8th. Pt stated that she could not do an appt at all that day because she has to work. Pt was advised to keep a record of her bp and if it got really high around 170/110 then she would need to be evaluated. Pt gave a verbal understanding. Spoke with Dr. Derrel Nip about this in person. Dr. Derrel Nip agreed.

## 2019-11-07 ENCOUNTER — Other Ambulatory Visit: Payer: Self-pay

## 2019-11-07 MED ORDER — LOSARTAN POTASSIUM 100 MG PO TABS: ORAL_TABLET | ORAL | 1 refills | Status: DC

## 2019-11-10 ENCOUNTER — Telehealth: Payer: Self-pay | Admitting: Internal Medicine

## 2019-11-10 DIAGNOSIS — I10 Essential (primary) hypertension: Secondary | ICD-10-CM

## 2019-11-10 MED ORDER — AMLODIPINE BESYLATE 2.5 MG PO TABS: 2.5000 mg | ORAL_TABLET | Freq: Every day | ORAL | 3 refills | Status: DC

## 2019-11-10 NOTE — Telephone Encounter (Signed)
Sent via [employer].  (next time ask these questions)   What medication did the ED start you on for your blood pressure?  What is your most recent reading?

## 2019-11-10 NOTE — Telephone Encounter (Signed)
Spoke with pt and she has been scheduled for Wednesday 11/12/2019 at [place].

## 2019-11-10 NOTE — Addendum Note (Signed)
Addended by: Crecencio Mc on: 11/10/2019 01:04 PM   Modules accepted: Orders

## 2019-11-10 NOTE — Telephone Encounter (Signed)
Pt stated that the ED did not give her any medication for blood pressure. She stated that they told her to follow up with PCP. Pt's most recent bp reading was from last night and it was 143/102.

## 2019-11-10 NOTE — Telephone Encounter (Signed)
Continue losartan.  Add amlodipine 2.5 mg starting tonight .  Has not had labs since Feb 2020 so move appointment out until she can have the ordered labs done.

## 2019-11-10 NOTE — Telephone Encounter (Signed)
Spoke with pt and informed her of the medication that Dr. Derrel Nip would like for her to start taking as well as continuing the Losartan. Scheduled pt a lab appt for 11/19/2019, canceled the appt for 11/12/2019 and she is going to keep her appt with Dr. Derrel Nip on 12/03/2019. Pt was advised to keep a record of her blood pressure readings once she starts the new bp medication and has been on it for several days and then mychart [place] the readings. Pt gave a verbal understanding.

## 2019-11-10 NOTE — Telephone Encounter (Signed)
Pt was seen in ED on 11/07/19 for blood pressure 181/110. They told her she needs to see Dr. Derrel Nip sooner than 3/31. Please advise.

## 2019-11-12 ENCOUNTER — Ambulatory Visit: Payer: BC Managed Care – PPO | Admitting: Internal Medicine

## 2019-11-19 ENCOUNTER — Other Ambulatory Visit (INDEPENDENT_AMBULATORY_CARE_PROVIDER_SITE_OTHER): Payer: BC Managed Care – PPO

## 2019-11-19 ENCOUNTER — Other Ambulatory Visit: Payer: Self-pay

## 2019-11-19 DIAGNOSIS — I10 Essential (primary) hypertension: Secondary | ICD-10-CM | POA: Diagnosis not present

## 2019-11-19 LAB — LIPID PANEL
Cholesterol: 222 mg/dL — ABNORMAL HIGH (ref 0–200)
HDL: 55.4 mg/dL (ref >39.00)
LDL Cholesterol: 139 mg/dL — ABNORMAL HIGH (ref 0–99)
NonHDL: 166.89
Total CHOL/HDL Ratio: 4
Triglycerides: 141 mg/dL (ref 0.0–149.0)
VLDL: 28.2 mg/dL (ref 0.0–40.0)

## 2019-11-19 LAB — MICROALBUMIN / CREATININE URINE RATIO
Creatinine,U: 109.6 mg/dL
Microalb Creat Ratio: 0.8 mg/g (ref 0.0–30.0)
Microalb, Ur: 0.9 mg/dL (ref 0.0–1.9)

## 2019-11-19 LAB — COMPREHENSIVE METABOLIC PANEL
ALT: 11 U/L (ref 0–35)
AST: 14 U/L (ref 0–37)
Albumin: 4.1 g/dL (ref 3.5–5.2)
Alkaline Phosphatase: 49 U/L (ref 39–117)
BUN: 16 mg/dL (ref 6–23)
CO2: 28 mEq/L (ref 19–32)
Calcium: 9.4 mg/dL (ref 8.4–10.5)
Chloride: 104 mEq/L (ref 96–112)
Creatinine, Ser: 0.81 mg/dL (ref 0.40–1.20)
GFR: 75.01 mL/min (ref >60.00)
Glucose, Bld: 106 mg/dL — ABNORMAL HIGH (ref 70–99)
Potassium: 3.5 mEq/L (ref 3.5–5.1)
Sodium: 138 mEq/L (ref 135–145)
Total Bilirubin: 0.3 mg/dL (ref 0.2–1.2)
Total Protein: 6.8 g/dL (ref 6.0–8.3)

## 2019-11-26 ENCOUNTER — Other Ambulatory Visit: Payer: Self-pay | Admitting: Internal Medicine

## 2019-11-26 MED ORDER — AMLODIPINE BESYLATE 10 MG PO TABS: 10.0000 mg | ORAL_TABLET | Freq: Every day | ORAL | 1 refills | Status: DC

## 2019-12-01 ENCOUNTER — Other Ambulatory Visit: Payer: Self-pay

## 2019-12-03 ENCOUNTER — Other Ambulatory Visit: Payer: Self-pay

## 2019-12-03 ENCOUNTER — Ambulatory Visit: Payer: BC Managed Care – PPO | Admitting: Internal Medicine

## 2019-12-03 ENCOUNTER — Encounter: Payer: Self-pay | Admitting: Internal Medicine

## 2019-12-03 VITALS — BP 132/92 | HR 93 | Temp 98.0°F | Resp 15 | Ht 64.5 in | Wt 197.6 lb

## 2019-12-03 DIAGNOSIS — R7301 Impaired fasting glucose: Secondary | ICD-10-CM

## 2019-12-03 DIAGNOSIS — I10 Essential (primary) hypertension: Secondary | ICD-10-CM | POA: Diagnosis not present

## 2019-12-03 LAB — POCT GLYCOSYLATED HEMOGLOBIN (HGB A1C): Hemoglobin A1C: 5.2 % (ref 4.0–5.6)

## 2019-12-03 MED ORDER — TRAMADOL HCL 50 MG PO TABS: 50.0000 mg | ORAL_TABLET | Freq: Three times a day (TID) | ORAL | 0 refills | Status: DC | PRN

## 2019-12-03 NOTE — Patient Instructions (Addendum)
continue losartan and amlodipine  and stop the meloxicam  for one week  Check BP daily during week break   If not < 130/80,  We will need to add a 3rd medication and look for 2nd causes of high blood pressure     You can take up to 2000 mg of acetominophen (tylenol) every day safely  In divided doses (500 mg every 6 hours  Or 1000 mg every 12 hours.)  Add tramadol  If needed in the evenings    Your cholesterol is not high enough to warrant medication at this time. (7% risk over the next ten years of having a heart attack)  We will repeat it annually   Managing Your Hypertension Hypertension is commonly called high blood pressure. This is when the force of your blood pressing against the walls of your arteries is too strong. Arteries are blood vessels that carry blood from your heart throughout your body. Hypertension forces the heart to work harder to pump blood, and may cause the arteries to become narrow or stiff. Having untreated or uncontrolled hypertension can cause heart attack, stroke, kidney disease, and other problems. What are blood pressure readings? A blood pressure reading consists of a higher number over a lower number. Ideally, your blood pressure should be below 120/80. The first ("top") number is called the systolic pressure. It is a measure of the pressure in your arteries as your heart beats. The second ("bottom") number is called the diastolic pressure. It is a measure of the pressure in your arteries as the heart relaxes. What does my blood pressure reading mean? Blood pressure is classified into four stages. Based on your blood pressure reading, your health care provider may use the following stages to determine what type of treatment you need, if any. Systolic pressure and diastolic pressure are measured in a unit called mm Hg. Normal  Systolic pressure: below 123456.  Diastolic pressure: below 80. Elevated  Systolic pressure: [id number].  Diastolic pressure: below  80. Hypertension stage 1  Systolic pressure: 0000000.  Diastolic pressure: [id number]. Hypertension stage 2  Systolic pressure: [id number] or above.  Diastolic pressure: 90 or above. What health risks are associated with hypertension? Managing your hypertension is an important responsibility. Uncontrolled hypertension can lead to:  A heart attack.  A stroke.  A weakened blood vessel (aneurysm).  Heart failure.  Kidney damage.  Eye damage.  Metabolic syndrome.  Memory and concentration problems. What changes can I make to manage my hypertension? Hypertension can be managed by making lifestyle changes and possibly by taking medicines. Your health care provider will help you make a plan to bring your blood pressure within a normal range. Eating and drinking   Eat a diet that is high in fiber and potassium, and low in salt (sodium), added sugar, and fat. An example eating plan is called the DASH (Dietary Approaches to Stop Hypertension) diet. To eat this way: ? Eat plenty of fresh fruits and vegetables. Try to fill half of your plate at each meal with fruits and vegetables. ? Eat whole grains, such as whole wheat pasta, brown rice, or whole grain bread. Fill about one quarter of your plate with whole grains. ? Eat low-fat diary products. ? Avoid fatty cuts of meat, processed or cured meats, and poultry with skin. Fill about one quarter of your plate with lean proteins such as fish, chicken without skin, beans, eggs, and tofu. ? Avoid premade and processed foods. These tend to be higher in sodium,  added sugar, and fat.  Reduce your daily sodium intake. Most people with hypertension should eat less than 1,500 mg of sodium a day.  Limit alcohol intake to no more than 1 drink a day for nonpregnant women and 2 drinks a day for men. One drink equals 12 oz of beer, 5 oz of wine, or 1 oz of hard liquor. Lifestyle  Work with your health care provider to maintain a healthy body weight, or to  lose weight. Ask what an ideal weight is for you.  Get at least 30 minutes of exercise that causes your heart to beat faster (aerobic exercise) most days of the week. Activities may include walking, swimming, or biking.  Include exercise to strengthen your muscles (resistance exercise), such as weight lifting, as part of your weekly exercise routine. Try to do these types of exercises for 30 minutes at least 3 days a week.  Do not use any products that contain nicotine or tobacco, such as cigarettes and e-cigarettes. If you need help quitting, ask your health care provider.  Control any long-term (chronic) conditions you have, such as high cholesterol or diabetes. Monitoring  Monitor your blood pressure at home as told by your health care provider. Your personal target blood pressure may vary depending on your medical conditions, your age, and other factors.  Have your blood pressure checked regularly, as often as told by your health care provider. Working with your health care provider  Review all the medicines you take with your health care provider because there may be side effects or interactions.  Talk with your health care provider about your diet, exercise habits, and other lifestyle factors that may be contributing to hypertension.  Visit your health care provider regularly. Your health care provider can help you create and adjust your plan for managing hypertension. Will I need medicine to control my blood pressure? Your health care provider may prescribe medicine if lifestyle changes are not enough to get your blood pressure under control, and if:  Your systolic blood pressure is 130 or higher.  Your diastolic blood pressure is 80 or higher. Take medicines only as told by your health care provider. Follow the directions carefully. Blood pressure medicines must be taken as prescribed. The medicine does not work as well when you skip doses. Skipping doses also puts you at risk for  problems. Contact a health care provider if:  You think you are having a reaction to medicines you have taken.  You have repeated (recurrent) headaches.  You feel dizzy.  You have swelling in your ankles.  You have trouble with your vision. Get help right away if:  You develop a severe headache or confusion.  You have unusual weakness or numbness, or you feel faint.  You have severe pain in your chest or abdomen.  You vomit repeatedly.  You have trouble breathing. Summary  Hypertension is when the force of blood pumping through your arteries is too strong. If this condition is not controlled, it may put you at risk for serious complications.  Your personal target blood pressure may vary depending on your medical conditions, your age, and other factors. For most people, a normal blood pressure is less than 120/80.  Hypertension is managed by lifestyle changes, medicines, or both. Lifestyle changes include weight loss, eating a healthy, low-sodium diet, exercising more, and limiting alcohol. This information is not intended to replace advice given to you by your health care provider. Make sure you discuss any questions you have  with your health care provider. Document Revised: 12/13/2018 Document Reviewed: 07/19/2016 Elsevier Patient Education  [place].

## 2019-12-03 NOTE — Progress Notes (Signed)
Subjective:  Patient ID: Gabrielle Nelson, female    DOB: 08-20-1971  Age: 49 y.o. MRN: [id number]  CC: The primary encounter diagnosis was Impaired fasting glucose. Diagnoses of Essential hypertension and Morbid obesity ([place]) were also pertinent to this visit.  HPI Gabrielle Nelson presents for  Follow up on hypertension,  New onset ,  Noted during recent Endocrinology evaluation   Home readings have been checked and had been averaging  130/90 on 5 mg amlodipine and 100 mg losartan.  She has been taking he 10 mg dose of amlodipine since March 22   Taking mobic daily for knee arthritis .  Not much caffeine. No bedpartner.  Not sure if she snores.  Is obese. . Denies hypersomnolence ,  But wakes up occasionally tired and previous mornign headache now resolved    Morbid obesity with IPG:  she has been trying to lose weight  20 lbs from august 2020 to Nov 2020 usng the intermittent fasting .  Weight has plateaued at 195 and stays under 200 . 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  Outpatient Medications Prior to Visit  Medication Sig Dispense Refill  . Acetaminophen 500 MG coapsule     . ALPRAZolam (XANAX) 0.5 MG tablet Take 1 tablet (0.5 mg total) by mouth at bedtime as needed for anxiety or sleep. 30 tablet 1  . amLODipine (NORVASC) 10 MG tablet Take 1 tablet (10 mg total) by mouth daily. 90 tablet 1  . Cholecalciferol (VITAMIN D3) 1000 units CAPS Take 2,000 Units by mouth daily.    . famotidine (PEPCID) 20 MG tablet TAKE 1 TABLET(20 MG) BY MOUTH TWICE DAILY 180 tablet 1  . fexofenadine (ALLEGRA) 180 MG tablet Take 180 mg by mouth daily.    . fish oil-omega-3 fatty acids 1000 MG capsule Take 1,200 mg by mouth 4 (four) times daily.     Marland Kitchen glucosamine-chondroitin 500-400 MG tablet Take 2 tablets by mouth daily.     Marland Kitchen losartan (COZAAR) 100 MG tablet TAKE 1 TABLET(100 MG) BY MOUTH DAILY 90 tablet 1  . meloxicam (MOBIC) 15 MG tablet  Take 1 tablet (15 mg total) by mouth daily as needed for pain. 90 tablet 0   No facility-administered medications prior to visit.    Review of Systems;  Patient denies headache, fevers, malaise, unintentional weight loss, skin rash, eye pain, sinus congestion and sinus pain, sore throat, dysphagia,  hemoptysis , cough, dyspnea, wheezing, chest pain, palpitations, orthopnea, edema, abdominal pain, nausea, melena, diarrhea, constipation, flank pain, dysuria, hematuria, urinary  Frequency, nocturia, numbness, tingling, seizures,  Focal weakness, Loss of consciousness,  Tremor, insomnia, depression, anxiety, and suicidal ideation.      Objective:  BP (!) 132/92 (BP Location: Left Arm, Patient Position: Sitting, Cuff Size: Normal)   Pulse 93   Temp 98 F (36.7 C) (Temporal)   Resp 15   Ht 5' 4.5" (1.638 m)   Wt 197 lb 9.6 oz (89.6 kg)   SpO2 98%   BMI 33.39 kg/m   BP Readings from Last 3 Encounters:  12/03/19 (!) 132/92  06/19/18 128/88  06/18/17 128/76    Wt Readings from Last 3 Encounters:  12/03/19 197 lb 9.6 oz (89.6 kg)  06/19/18 203 lb 9.6 oz (92.4 kg)  06/18/17 213 lb 3.2 oz (96.7 kg)    General appearance: alert, cooperative and appears stated age Ears: normal TM's and external ear canals  both ears Throat: lips, mucosa, and tongue normal; teeth and gums normal Neck: no adenopathy, no carotid bruit, supple, symmetrical, trachea midline and thyroid not enlarged, symmetric, no tenderness/mass/nodules Back: symmetric, no curvature. ROM normal. No CVA tenderness. Lungs: clear to auscultation bilaterally Heart: regular rate and rhythm, S1, S2 normal, no murmur, click, rub or gallop Abdomen: soft, non-tender; bowel sounds normal; no masses,  no organomegaly Pulses: 2+ and symmetric Skin: Skin color, texture, turgor normal. No rashes or lesions Lymph nodes: Cervical, supraclavicular, and axillary nodes normal.  Lab Results  Component Value Date   HGBA1C 5.2 12/03/2019    HGBA1C 5.1 06/19/2018   HGBA1C 5.3 04/05/2016    Lab Results  Component Value Date   CREATININE 0.81 11/19/2019   CREATININE 0.81 01/03/2019   CREATININE 0.80 06/19/2018    Lab Results  Component Value Date   WBC 5.0 01/08/2014   HGB 12.9 01/08/2014   HCT 37.9 01/08/2014   PLT 220.0 01/08/2014   GLUCOSE 106 (H) 11/19/2019   CHOL 222 (H) 11/19/2019   TRIG 141.0 11/19/2019   HDL 55.40 11/19/2019   LDLCALC 139 (H) 11/19/2019   ALT 11 11/19/2019   AST 14 11/19/2019   NA 138 11/19/2019   K 3.5 11/19/2019   CL 104 11/19/2019   CREATININE 0.81 11/19/2019   BUN 16 11/19/2019   CO2 28 11/19/2019   TSH 1.24 06/19/2018   HGBA1C 5.2 12/03/2019   MICROALBUR 0.9 11/19/2019    CT SOFT TISSUE NECK W CONTRAST  Result Date: 12/04/2018 CLINICAL DATA:  Mass posterior to the right upper thyroid on outside ultrasound in 10/2018. Chronic right thyroid nodule, previously biopsied. EXAM: CT NECK WITH CONTRAST TECHNIQUE: Multidetector CT imaging of the neck was performed using the standard protocol following the bolus administration of intravenous contrast. CONTRAST:  9mL OMNIPAQUE IOHEXOL 300 MG/ML  SOLN COMPARISON:  Thyroid ultrasound report 10/30/2018. Thyroid ultrasounds 07/05/2017 and 12/22/2010 from [facility]. FINDINGS: Pharynx and larynx: No evidence of pharyngeal mass or swelling. Unremarkable larynx. Patent airway. Salivary glands: No inflammation, mass, or stone. Thyroid: A dominant, solid right upper pole thyroid nodule measures 2.9 cm and has been previously evaluated by ultrasound and biopsy. Just superior to this is a 1.8 x 1.7 x 1.9 cm mass which is homogeneously hypodense but has attenuation greater than water and is without a focal enhancing component. This mass has an interface with the superior thyroid nodule, however it largely appears to be separate from the thyroid. The mass abuts the posterior margin of the right submandibular gland. This mass appears to correlate with what was labeled  as a cystic superior pole thyroid nodule on the 2018 and 2012 studies and has not significantly changed in size. Lymph nodes: No enlarged or suspicious lymph nodes in the neck. Vascular: Major vascular structures of the neck are patent. Limited intracranial: Unremarkable. Visualized orbits: Unremarkable. Mastoids and visualized paranasal sinuses: Moderate right sphenoid sinus mucosal thickening. Clear mastoid air cells. Skeleton: No acute osseous abnormality or suspicious osseous lesion. Upper chest: Clear lung apices. Other: None. IMPRESSION: 1. 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 This is unchanged in size from 2012 and therefore felt to be benign with considerations including a branchial cleft cyst, lymphatic malformation, and possibly exophytic thyroid or parathyroid cyst. No definite enhancing component to indicate a neoplasm. 2. 2.9 cm solid right upper pole thyroid nodule, previously evaluated by ultrasound and biopsy. Electronically Signed   By: Logan Bores M.D.   On: 12/04/2018 09:41  Assessment & Plan:   Problem List Items Addressed This Visit      Unprioritized   Morbid obesity ([place])    She was screened for diabetes , a 1c is normal.  I have addressed  BMI and recommended a low glycemic index diet utilizing smaller more frequent meals to increase metabolism.  I have also recommended that patient start exercising with a goal of 30 minutes of aerobic exercise a minimum of 5 days per week. Screening for lipid disorders, thyroid and diabetes to be done today.        Essential hypertension    Improved control currently Advised to continue amlodipine at dose of  10 mg daily and continue losartan and suspend the daily NSAID .  rx for tramadol given  Secondary causes of hypertension discussed today;  She is reluctant to consider a sleep study        Other Visit Diagnoses    Impaired fasting glucose    -  Primary   Relevant Orders   POCT HgB A1C (Completed)      I am  having Gabrielle Nelson start on traMADol. I am also having her maintain her fish oil-omega-3 fatty acids, glucosamine-chondroitin, Vitamin D3, fexofenadine, ALPRAZolam, Acetaminophen, famotidine, meloxicam, losartan, and amLODipine.  Meds ordered this encounter  Medications  . traMADol (ULTRAM) 50 MG tablet    Sig: Take 1 tablet (50 mg total) by mouth every 8 (eight) hours as needed for up to 5 days.    Dispense:  15 tablet    Refill:  0    There are no discontinued medications.  Follow-up: Return in about 4 weeks (around 12/31/2019).   Crecencio Mc, MD

## 2019-12-05 NOTE — Assessment & Plan Note (Addendum)
Los of control noted over the last several weeks,  And still not at goal on maximal doses of amlodipine and losartan  Secondary causes suspected.    advised to continue amlodipine at dose of  10 mg daily and continue losartan and suspend the daily NSAID .  rx for tramadol given  Secondary causes of hypertension discussed today;  She is reluctant to consider a sleep study

## 2019-12-05 NOTE — Assessment & Plan Note (Signed)
She was screened for diabetes , a 1c is normal.  I have addressed  BMI and recommended a low glycemic index diet utilizing smaller more frequent meals to increase metabolism.  I have also recommended that patient start exercising with a goal of 30 minutes of aerobic exercise a minimum of 5 days per week. Screening for lipid disorders, thyroid and diabetes to be done today.

## 2019-12-09 MED ORDER — TRAMADOL HCL 50 MG PO TABS: 50.0000 mg | ORAL_TABLET | Freq: Three times a day (TID) | ORAL | 0 refills | Status: DC | PRN

## 2019-12-11 ENCOUNTER — Other Ambulatory Visit: Payer: Self-pay | Admitting: Internal Medicine

## 2019-12-11 ENCOUNTER — Other Ambulatory Visit: Payer: Self-pay

## 2019-12-11 DIAGNOSIS — E876 Hypokalemia: Secondary | ICD-10-CM

## 2019-12-11 MED ORDER — TRIAMTERENE-HCTZ 37.5-25 MG PO TABS: 1.0000 | ORAL_TABLET | Freq: Every day | ORAL | 3 refills | Status: DC

## 2019-12-12 MED ORDER — TRAMADOL HCL 50 MG PO TABS: 50.0000 mg | ORAL_TABLET | Freq: Two times a day (BID) | ORAL | 0 refills | Status: DC | PRN

## 2019-12-12 NOTE — Telephone Encounter (Signed)
Refill request for Tramadol, last seen 12-03-19, last filled 12-09-19.  Please advise.

## 2020-01-02 ENCOUNTER — Other Ambulatory Visit: Payer: Self-pay

## 2020-01-02 ENCOUNTER — Encounter: Payer: Self-pay | Admitting: Internal Medicine

## 2020-01-02 ENCOUNTER — Ambulatory Visit: Payer: BC Managed Care – PPO | Admitting: Internal Medicine

## 2020-01-02 DIAGNOSIS — I10 Essential (primary) hypertension: Secondary | ICD-10-CM

## 2020-01-02 DIAGNOSIS — E876 Hypokalemia: Secondary | ICD-10-CM | POA: Diagnosis not present

## 2020-01-02 DIAGNOSIS — T502X5A Adverse effect of carbonic-anhydrase inhibitors, benzothiadiazides and other diuretics, initial encounter: Secondary | ICD-10-CM

## 2020-01-02 LAB — BASIC METABOLIC PANEL
BUN: 18 mg/dL (ref 6–23)
CO2: 29 mEq/L (ref 19–32)
Calcium: 9.5 mg/dL (ref 8.4–10.5)
Chloride: 104 mEq/L (ref 96–112)
Creatinine, Ser: 0.87 mg/dL (ref 0.40–1.20)
GFR: 69.04 mL/min (ref >60.00)
Glucose, Bld: 104 mg/dL — ABNORMAL HIGH (ref 70–99)
Potassium: 4 mEq/L (ref 3.5–5.1)
Sodium: 140 mEq/L (ref 135–145)

## 2020-01-02 MED ORDER — METOPROLOL SUCCINATE ER 50 MG PO TB24: 50.0000 mg | ORAL_TABLET | Freq: Every day | ORAL | 3 refills | Status: DC

## 2020-01-02 NOTE — Assessment & Plan Note (Signed)
I have addressed  BMI and recommended wt loss of 10% of body weigh over the next 6 months using a low glycemic index diet and regular exercise a minimum of 5 days per week.   

## 2020-01-02 NOTE — Assessment & Plan Note (Signed)
Not at goal on 3 medications.  Adding toprol XL 50 mg at bedtime.  2ndary causes not ruled out per patient preference.  DASH diet ,  Exercise,  Weight loss recommended.  Goal 120/70

## 2020-01-02 NOTE — Patient Instructions (Signed)
Adding metoprolol XL 50 mg at bedtime  Gaol BP is 120/70 !!   You are now on 4 medications!  Please send me BP readings

## 2020-01-02 NOTE — Progress Notes (Signed)
Subjective:  Patient ID: Gabrielle Nelson, female    DOB: 08-Sep-1970  Age: 49 y.o. MRN: [id number]  CC: Diagnoses of Diuretic-induced hypokalemia, Essential hypertension, and Morbid obesity ([place]) were pertinent to this visit.  HPI Gabrielle Nelson presents for follow up on hypertension  This visit occurred during the SARS-CoV-2 public health emergency.  Safety protocols were in place, including screening questions prior to the visit, additional usage of staff PPE, and extensive cleaning of exam room while observing appropriate contact time as indicated for disinfecting solutions.    Patient has received NO  doses of the available COVID 19 vaccines.   Patient continues to mask when outside of the home except when walking in yard or at safe distances from others .  Patient denies any change in mood or development of unhealthy behaviors resuting from the pandemic's restriction of activities and socialization.    Hypertension: patient checks blood pressure twice weekly at home.  Readings have been for labile  125/76 some times,  Then other times 132/88 . Patient is following a reduce salt diet most days and is taking medications as prescribed: amlodipine 10 mg,  losartan 100 mg daily,  maxzide .  She denies snoring  But has morning fatigue once or twice per week after shift changes.  No daytime hypersomnolence.  Some LE edema since starting amlodipine.  Managing with stockings and maxzide.     Outpatient Medications Prior to Visit  Medication Sig Dispense Refill   Acetaminophen 500 MG coapsule      ALPRAZolam (XANAX) 0.5 MG tablet Take 1 tablet (0.5 mg total) by mouth at bedtime as needed for anxiety or sleep. 30 tablet 1   amLODipine (NORVASC) 10 MG tablet Take 1 tablet (10 mg total) by mouth daily. 90 tablet 1   Cholecalciferol (VITAMIN D3) 1000 units CAPS Take 2,000 Units by mouth daily.     famotidine (PEPCID) 20 MG tablet TAKE 1 TABLET(20 MG) BY MOUTH TWICE DAILY 180 tablet 1    fexofenadine (ALLEGRA) 180 MG tablet Take 180 mg by mouth daily.     fish oil-omega-3 fatty acids 1000 MG capsule Take 1,200 mg by mouth 4 (four) times daily.      glucosamine-chondroitin 500-400 MG tablet Take 2 tablets by mouth daily.      losartan (COZAAR) 100 MG tablet TAKE 1 TABLET(100 MG) BY MOUTH DAILY 90 tablet 1   traMADol (ULTRAM) 50 MG tablet Take 1 tablet (50 mg total) by mouth every 12 (twelve) hours as needed for moderate pain. 60 tablet 0   triamterene-hydrochlorothiazide (MAXZIDE-25) 37.5-25 MG tablet Take 1 tablet by mouth daily. 90 tablet 3   meloxicam (MOBIC) 15 MG tablet Take 1 tablet (15 mg total) by mouth daily as needed for pain. (Patient not taking: Reported on 01/02/2020) 90 tablet 0   No facility-administered medications prior to visit.    Review of Systems;  Patient denies headache, fevers, malaise, unintentional weight loss, skin rash, eye pain, sinus congestion and sinus pain, sore throat, dysphagia,  hemoptysis , cough, dyspnea, wheezing, chest pain, palpitations, orthopnea, edema, abdominal pain, nausea, melena, diarrhea, constipation, flank pain, dysuria, hematuria, urinary  Frequency, nocturia, numbness, tingling, seizures,  Focal weakness, Loss of consciousness,  Tremor, insomnia, depression, anxiety, and suicidal ideation.      Objective:  BP 126/88 (BP Location: Left Arm, Patient Position: Sitting, Cuff Size: Normal)    Pulse 99    Temp 97.8 F (36.6 C) (Temporal)    Resp 15  Ht 5' 4.5" (1.638 m)    Wt 197 lb 12.8 oz (89.7 kg)    SpO2 98%    BMI 33.43 kg/m   BP Readings from Last 3 Encounters:  01/02/20 126/88  12/03/19 (!) 132/92  06/19/18 128/88    Wt Readings from Last 3 Encounters:  01/02/20 197 lb 12.8 oz (89.7 kg)  12/03/19 197 lb 9.6 oz (89.6 kg)  06/19/18 203 lb 9.6 oz (92.4 kg)    General appearance: alert, cooperative and appears stated age Ears: normal TM's and external ear canals both ears Throat: lips, mucosa, and tongue  normal; teeth and gums normal Neck: no adenopathy, no carotid bruit, supple, symmetrical, trachea midline and thyroid not enlarged, symmetric, no tenderness/mass/nodules Back: symmetric, no curvature. ROM normal. No CVA tenderness. Lungs: clear to auscultation bilaterally Heart: regular rate and rhythm, S1, S2 normal, no murmur, click, rub or gallop Abdomen: soft, non-tender; bowel sounds normal; no masses,  no organomegaly Pulses: 2+ and symmetric Skin: Skin color, texture, turgor normal. No rashes or lesions Lymph nodes: Cervical, supraclavicular, and axillary nodes normal.  Lab Results  Component Value Date   HGBA1C 5.2 12/03/2019   HGBA1C 5.1 06/19/2018   HGBA1C 5.3 04/05/2016    Lab Results  Component Value Date   CREATININE 0.81 11/19/2019   CREATININE 0.81 01/03/2019   CREATININE 0.80 06/19/2018    Lab Results  Component Value Date   WBC 5.0 01/08/2014   HGB 12.9 01/08/2014   HCT 37.9 01/08/2014   PLT 220.0 01/08/2014   GLUCOSE 106 (H) 11/19/2019   CHOL 222 (H) 11/19/2019   TRIG 141.0 11/19/2019   HDL 55.40 11/19/2019   LDLCALC 139 (H) 11/19/2019   ALT 11 11/19/2019   AST 14 11/19/2019   NA 138 11/19/2019   K 3.5 11/19/2019   CL 104 11/19/2019   CREATININE 0.81 11/19/2019   BUN 16 11/19/2019   CO2 28 11/19/2019   TSH 1.24 06/19/2018   HGBA1C 5.2 12/03/2019   MICROALBUR 0.9 11/19/2019    CT SOFT TISSUE NECK W CONTRAST  Result Date: 12/04/2018 CLINICAL DATA:  Mass posterior to the right upper thyroid on outside ultrasound in 10/2018. Chronic right thyroid nodule, previously biopsied. EXAM: CT NECK WITH CONTRAST TECHNIQUE: Multidetector CT imaging of the neck was performed using the standard protocol following the bolus administration of intravenous contrast. CONTRAST:  27mL OMNIPAQUE IOHEXOL 300 MG/ML  SOLN COMPARISON:  Thyroid ultrasound report 10/30/2018. Thyroid ultrasounds 07/05/2017 and 12/22/2010 from [facility]. FINDINGS: Pharynx and larynx: No evidence of  pharyngeal mass or swelling. Unremarkable larynx. Patent airway. Salivary glands: No inflammation, mass, or stone. Thyroid: A dominant, solid right upper pole thyroid nodule measures 2.9 cm and has been previously evaluated by ultrasound and biopsy. Just superior to this is a 1.8 x 1.7 x 1.9 cm mass which is homogeneously hypodense but has attenuation greater than water and is without a focal enhancing component. This mass has an interface with the superior thyroid nodule, however it largely appears to be separate from the thyroid. The mass abuts the posterior margin of the right submandibular gland. This mass appears to correlate with what was labeled as a cystic superior pole thyroid nodule on the 2018 and 2012 studies and has not significantly changed in size. Lymph nodes: No enlarged or suspicious lymph nodes in the neck. Vascular: Major vascular structures of the neck are patent. Limited intracranial: Unremarkable. Visualized orbits: Unremarkable. Mastoids and visualized paranasal sinuses: Moderate right sphenoid sinus mucosal thickening. Clear mastoid air cells.  Skeleton: No acute osseous abnormality or suspicious osseous lesion. Upper chest: Clear lung apices. Other: None. IMPRESSION: 1. 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 This is unchanged in size from 2012 and therefore felt to be benign with considerations including a branchial cleft cyst, lymphatic malformation, and possibly exophytic thyroid or parathyroid cyst. No definite enhancing component to indicate a neoplasm. 2. 2.9 cm solid right upper pole thyroid nodule, previously evaluated by ultrasound and biopsy. Electronically Signed   By: Logan Bores M.D.   On: 12/04/2018 09:41    Assessment & Plan:   Problem List Items Addressed This Visit      Unprioritized   Essential hypertension    Not at goal on 3 medications.  Adding toprol XL 50 mg at bedtime.  2ndary causes not ruled out per patient preference.  DASH diet ,   Exercise,  Weight loss recommended.  Goal 120/70      Relevant Medications   metoprolol succinate (TOPROL-XL) 50 MG 24 hr tablet   Morbid obesity ([facility])    I have addressed  BMI and recommended wt loss of 10% of body weigh over the next 6 months using a low glycemic index diet and regular exercise a minimum of 5 days per week.         Other Visit Diagnoses    Diuretic-induced hypokalemia         I provided  30 minutes of  face-to-face time during this encounter reviewing patient's current problems and past surgeries, labs and imaging studies, providing counseling on the above mentioned problems , and coordination  of care .  I have discontinued Lissett L. Norcia's meloxicam. I am also having her start on metoprolol succinate. Additionally, I am having her maintain her fish oil-omega-3 fatty acids, glucosamine-chondroitin, Vitamin D3, fexofenadine, ALPRAZolam, Acetaminophen, famotidine, losartan, amLODipine, triamterene-hydrochlorothiazide, and traMADol.  Meds ordered this encounter  Medications   metoprolol succinate (TOPROL-XL) 50 MG 24 hr tablet    Sig: Take 1 tablet (50 mg total) by mouth daily. Take with or immediately following a meal.    Dispense:  90 tablet    Refill:  3    Medications Discontinued During This Encounter  Medication Reason   meloxicam (MOBIC) 15 MG tablet Patient has not taken in last 30 days    Follow-up: No follow-ups on file.   Crecencio Mc, MD

## 2020-01-09 ENCOUNTER — Other Ambulatory Visit: Payer: Self-pay

## 2020-01-09 DIAGNOSIS — M25569 Pain in unspecified knee: Secondary | ICD-10-CM

## 2020-01-09 DIAGNOSIS — I10 Essential (primary) hypertension: Secondary | ICD-10-CM

## 2020-01-09 NOTE — Telephone Encounter (Signed)
Refilled: 01/14/2020 Last OV: 01/02/2020 Next OV: 07/05/2020

## 2020-01-12 DIAGNOSIS — M25569 Pain in unspecified knee: Secondary | ICD-10-CM | POA: Insufficient documentation

## 2020-01-12 MED ORDER — TRAMADOL HCL 50 MG PO TABS: 50.0000 mg | ORAL_TABLET | Freq: Four times a day (QID) | ORAL | 2 refills | Status: DC | PRN

## 2020-01-12 NOTE — Assessment & Plan Note (Addendum)
Home /office readings are now at goal  With metoprolol reduced to 25 mg due to bradycardia , symptomatic.

## 2020-01-12 NOTE — Telephone Encounter (Signed)
  I have increased the quantity of tramadol to #120 per month,  This will allow you to take it every 6 hours if needed.  You will need to make an appointment to discuss your knee  pain management because I do not have any documentation of a knee problem and we will need to start working it up.   The blood pressure readings are at goal.  Continue current medications

## 2020-01-12 NOTE — Telephone Encounter (Signed)
Refill request for tramadol, last seen 01-02-20, last filled 12-15-19.  Please advise.

## 2020-01-13 MED ORDER — METOPROLOL SUCCINATE ER 25 MG PO TB24: 25.0000 mg | ORAL_TABLET | Freq: Every day | ORAL | 1 refills | Status: DC

## 2020-01-13 NOTE — Addendum Note (Signed)
Addended by: Crecencio Mc on: 01/13/2020 10:07 AM   Modules accepted: Orders

## 2020-01-20 ENCOUNTER — Other Ambulatory Visit: Payer: Self-pay

## 2020-01-20 MED ORDER — FAMOTIDINE 20 MG PO TABS: ORAL_TABLET | ORAL | 1 refills | Status: DC

## 2020-03-29 ENCOUNTER — Encounter: Payer: Self-pay | Admitting: Internal Medicine

## 2020-03-29 ENCOUNTER — Other Ambulatory Visit: Payer: Self-pay

## 2020-03-29 ENCOUNTER — Ambulatory Visit (INDEPENDENT_AMBULATORY_CARE_PROVIDER_SITE_OTHER): Payer: BC Managed Care – PPO | Admitting: Internal Medicine

## 2020-03-29 VITALS — BP 122/74 | HR 68 | Temp 98.1°F | Ht 64.49 in | Wt 201.0 lb

## 2020-03-29 DIAGNOSIS — I159 Secondary hypertension, unspecified: Secondary | ICD-10-CM

## 2020-03-29 DIAGNOSIS — D1721 Benign lipomatous neoplasm of skin and subcutaneous tissue of right arm: Secondary | ICD-10-CM | POA: Insufficient documentation

## 2020-03-29 DIAGNOSIS — R0683 Snoring: Secondary | ICD-10-CM | POA: Diagnosis not present

## 2020-03-29 DIAGNOSIS — I10 Essential (primary) hypertension: Secondary | ICD-10-CM

## 2020-03-29 DIAGNOSIS — M25471 Effusion, right ankle: Secondary | ICD-10-CM

## 2020-03-29 DIAGNOSIS — M25472 Effusion, left ankle: Secondary | ICD-10-CM

## 2020-03-29 HISTORY — DX: Effusion, left ankle: M25.471

## 2020-03-29 HISTORY — DX: Effusion, left ankle: M25.472

## 2020-03-29 MED ORDER — POTASSIUM CHLORIDE CRYS ER 20 MEQ PO TBCR
20.0000 meq | EXTENDED_RELEASE_TABLET | Freq: Every day | ORAL | 3 refills | Status: DC
Start: 2020-03-29 — End: 2020-07-05

## 2020-03-29 MED ORDER — FUROSEMIDE 20 MG PO TABS: 20.0000 mg | ORAL_TABLET | Freq: Every day | ORAL | 3 refills | Status: DC

## 2020-03-29 MED ORDER — MELOXICAM 15 MG PO TABS: 15.0000 mg | ORAL_TABLET | Freq: Every day | ORAL | 0 refills | Status: DC

## 2020-03-29 NOTE — Patient Instructions (Addendum)
Switch losartan to morning . Take with the maxzide  Continue metoprolol and amlodipine at night  Sleep study to be ordered to rule out sleep apnea, as well as a    As a doppler study to rule out renal artery stenosis  Use the furosemide as needed for fluid retention   Ok to resume meloxicam.  Check BP after one week and make sure it has not risen to > 140/90  Referral to Dr Bary Castilla for lipoma surgery right shoulder blade

## 2020-03-29 NOTE — Assessment & Plan Note (Signed)
Her obesity is complicated by hypertension . I have addressed  BMI and recommended wt loss of 10% of body weight over the next 6 months using a low glycemic index diet and regular exercise a minimum of 5 days per week.

## 2020-03-29 NOTE — Assessment & Plan Note (Signed)
Now symptomatic with pain referred to right arm. Referring to [facility] for surgical removal with biopsy

## 2020-03-29 NOTE — Assessment & Plan Note (Signed)
Episodic,  likelyey multifactorial (salt intake ,  And use of amlodipine to manage hypertension), and may have untreated OSA.  Prescribing  furosemide/kcl for prn use,  and referring for sleep study

## 2020-03-29 NOTE — Progress Notes (Signed)
Subjective:  Patient ID: Gabrielle Nelson, female    DOB: Feb 09, 1971  Age: 49 y.o. MRN: [id number]  CC: The primary encounter diagnosis was Lipoma of right shoulder. Diagnoses of Snoring, Secondary hypertension, Morbid obesity ([place]), Essential hypertension, and Ankle edema, bilateral were also pertinent to this visit.  HPI Gabrielle Nelson presents for follow up on multiple issues.  1) recent episode of bilateral ankle edema.  Occurred one week ago while working the  8 to 4 shift at [employer].  Diet reviewed; after several attempts to recall diet,  She recalls that she had eaten several homemade sausage balls earlier In the day ("2 or 3")  . Denies any other salt intake.  BP was 140/90 on same day. Also had not worn the compression knee highs that day that she usually wears.  Not taking NSAIDs currently but wants to resume meloxicam for joint pain. Using tramadol and tylenol.  She has resumed wearing compression knees highs since the episode and has had no recurrence.  Denies orthopnea    2) HTN:  Patient is taking her medications as prescribed and notes no adverse effects.  Home BP readings have been done about once per week and are  generally < 150/80 .  She is avoiding added salt in her diet , but is not walking regularly or participating in other forms of exercise  .  She is taking 4 medications and maximal doses.   No prior sleep study , no prior renal doppler.   3) Right shoulder pain:  She attributes the pain to the enlarging lipoma on her back abutting the right shoulder blade .she has been taking 3 tramadol daily to manage the arm pain       Outpatient Medications Prior to Visit  Medication Sig Dispense Refill  . Acetaminophen 500 MG coapsule     . ALPRAZolam (XANAX) 0.5 MG tablet Take 1 tablet (0.5 mg total) by mouth at bedtime as needed for anxiety or sleep. 30 tablet 1  . amLODipine (NORVASC) 10 MG tablet Take 1 tablet (10 mg total) by mouth daily. 90 tablet 1  . Cholecalciferol  (VITAMIN D3) 1000 units CAPS Take 2,000 Units by mouth daily.    . famotidine (PEPCID) 20 MG tablet TAKE 1 TABLET(20 MG) BY MOUTH TWICE DAILY 180 tablet 1  . fexofenadine (ALLEGRA) 180 MG tablet Take 180 mg by mouth daily.    . fish oil-omega-3 fatty acids 1000 MG capsule Take 1,200 mg by mouth 4 (four) times daily.     Marland Kitchen glucosamine-chondroitin 500-400 MG tablet Take 2 tablets by mouth daily.     Marland Kitchen losartan (COZAAR) 100 MG tablet TAKE 1 TABLET(100 MG) BY MOUTH DAILY 90 tablet 1  . metoprolol succinate (TOPROL-XL) 25 MG 24 hr tablet Take 1 tablet (25 mg total) by mouth daily. Take with or immediately following a meal. 90 tablet 1  . triamterene-hydrochlorothiazide (MAXZIDE-25) 37.5-25 MG tablet Take 1 tablet by mouth daily. 90 tablet 3   No facility-administered medications prior to visit.    Review of Systems;  Patient denies headache, fevers, malaise, unintentional weight loss, skin rash, eye pain, sinus congestion and sinus pain, sore throat, dysphagia,  hemoptysis , cough, dyspnea, wheezing, chest pain, palpitations, orthopnea, edema, abdominal pain, nausea, melena, diarrhea, constipation, flank pain, dysuria, hematuria, urinary  Frequency, nocturia, numbness, tingling, seizures,  Focal weakness, Loss of consciousness,  Tremor, insomnia, depression, anxiety, and suicidal ideation.      Objective:  BP 122/74 (BP Location: Left  Arm, Patient Position: Sitting)   Pulse 68   Temp 98.1 F (36.7 C)   Ht 5' 4.49" (1.638 m)   Wt 201 lb (91.2 kg)   SpO2 98%   BMI 33.98 kg/m   BP Readings from Last 3 Encounters:  03/29/20 122/74  01/02/20 126/88  12/03/19 (!) 132/92    Wt Readings from Last 3 Encounters:  03/29/20 201 lb (91.2 kg)  01/02/20 197 lb 12.8 oz (89.7 kg)  12/03/19 197 lb 9.6 oz (89.6 kg)    General appearance: alert, cooperative and appears stated age Ears: normal TM's and external ear canals both ears Throat: lips, mucosa, and tongue normal; teeth and gums  normal Neck: no adenopathy, no carotid bruit, supple, symmetrical, trachea midline and thyroid not enlarged, symmetric, no tenderness/mass/nodules Back: symmetric, no curvature. ROM normal. No CVA tenderness. Lungs: clear to auscultation bilaterally Heart: regular rate and rhythm, S1, S2 normal, no murmur, click, rub or gallop Abdomen: soft, non-tender; bowel sounds normal; no masses,  no organomegaly Pulses: 2+ and symmetric Skin: Skin color, texture, turgor normal. No rashes or lesions Lymph nodes: Cervical, supraclavicular, and axillary nodes normal.  Lab Results  Component Value Date   HGBA1C 5.2 12/03/2019   HGBA1C 5.1 06/19/2018   HGBA1C 5.3 04/05/2016    Lab Results  Component Value Date   CREATININE 0.87 01/02/2020   CREATININE 0.81 11/19/2019   CREATININE 0.81 01/03/2019    Lab Results  Component Value Date   WBC 5.0 01/08/2014   HGB 12.9 01/08/2014   HCT 37.9 01/08/2014   PLT 220.0 01/08/2014   GLUCOSE 104 (H) 01/02/2020   CHOL 222 (H) 11/19/2019   TRIG 141.0 11/19/2019   HDL 55.40 11/19/2019   LDLCALC 139 (H) 11/19/2019   ALT 11 11/19/2019   AST 14 11/19/2019   NA 140 01/02/2020   K 4.0 01/02/2020   CL 104 01/02/2020   CREATININE 0.87 01/02/2020   BUN 18 01/02/2020   CO2 29 01/02/2020   TSH 1.24 06/19/2018   HGBA1C 5.2 12/03/2019   MICROALBUR 0.9 11/19/2019    CT SOFT TISSUE NECK W CONTRAST  Result Date: 12/04/2018 CLINICAL DATA:  Mass posterior to the right upper thyroid on outside ultrasound in 10/2018. Chronic right thyroid nodule, previously biopsied. EXAM: CT NECK WITH CONTRAST TECHNIQUE: Multidetector CT imaging of the neck was performed using the standard protocol following the bolus administration of intravenous contrast. CONTRAST:  95mL OMNIPAQUE IOHEXOL 300 MG/ML  SOLN COMPARISON:  Thyroid ultrasound report 10/30/2018. Thyroid ultrasounds 07/05/2017 and 12/22/2010 from [facility] FINDINGS: Pharynx and larynx: No evidence of pharyngeal mass or swelling.  Unremarkable larynx. Patent airway. Salivary glands: No inflammation, mass, or stone. Thyroid: A dominant, solid right upper pole thyroid nodule measures 2.9 cm and has been previously evaluated by ultrasound and biopsy. Just superior to this is a 1.8 x 1.7 x 1.9 cm mass which is homogeneously hypodense but has attenuation greater than water and is without a focal enhancing component. This mass has an interface with the superior thyroid nodule, however it largely appears to be separate from the thyroid. The mass abuts the posterior margin of the right submandibular gland. This mass appears to correlate with what was labeled as a cystic superior pole thyroid nodule on the 2018 and 2012 studies and has not significantly changed in size. Lymph nodes: No enlarged or suspicious lymph nodes in the neck. Vascular: Major vascular structures of the neck are patent. Limited intracranial: Unremarkable. Visualized orbits: Unremarkable. Mastoids and visualized paranasal sinuses:  Moderate right sphenoid sinus mucosal thickening. Clear mastoid air cells. Skeleton: No acute osseous abnormality or suspicious osseous lesion. Upper chest: Clear lung apices. Other: None. IMPRESSION: 1. 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 This is unchanged in size from 2012 and therefore felt to be benign with considerations including a branchial cleft cyst, lymphatic malformation, and possibly exophytic thyroid or parathyroid cyst. No definite enhancing component to indicate a neoplasm. 2. 2.9 cm solid right upper pole thyroid nodule, previously evaluated by ultrasound and biopsy. Electronically Signed   By: Logan Bores M.D.   On: 12/04/2018 09:41    Assessment & Plan:   Problem List Items Addressed This Visit      Unprioritized   Morbid obesity ([place])    Her obesity is complicated by hypertension . I have addressed  BMI and recommended wt loss of 10% of body weight over the next 6 months using a low glycemic index diet and  regular exercise a minimum of 5 days per week.        Relevant Orders   Home sleep test   Lipoma of right shoulder - Primary    Now symptomatic with pain referred to right arm. Referring to [facility] for surgical removal with biopsy       Relevant Orders   Ambulatory referral to General Surgery   Essential hypertension    Referring for sleep study and renal ultrasound, as she is on maximal doses of 4 medications.       Relevant Medications   furosemide (LASIX) 20 MG tablet   Ankle edema, bilateral    Episodic,  likelyey multifactorial (salt intake ,  And use of amlodipine to manage hypertension), and may have untreated OSA.  Prescribing  furosemide/kcl for prn use,  and referring for sleep study        Other Visit Diagnoses    Snoring       Relevant Orders   Home sleep test   Secondary hypertension       Relevant Medications   furosemide (LASIX) 20 MG tablet   Other Relevant Orders   Home sleep test   VAS US RENAL ARTERY DUPLEX      I am having Gabrielle Nelson start on furosemide, meloxicam, and potassium chloride SA. I am also having her maintain her fish oil-omega-3 fatty acids, glucosamine-chondroitin, Vitamin D3, fexofenadine, ALPRAZolam, Acetaminophen, losartan, amLODipine, triamterene-hydrochlorothiazide, metoprolol succinate, and famotidine.  Meds ordered this encounter  Medications  . furosemide (LASIX) 20 MG tablet    Sig: Take 1 tablet (20 mg total) by mouth daily.    Dispense:  30 tablet    Refill:  3  . meloxicam (MOBIC) 15 MG tablet    Sig: Take 1 tablet (15 mg total) by mouth daily.    Dispense:  30 tablet    Refill:  0  . potassium chloride SA (KLOR-CON) 20 MEQ tablet    Sig: Take 1 tablet (20 mEq total) by mouth daily.    Dispense:  30 tablet    Refill:  3    There are no discontinued medications.  Follow-up: No follow-ups on file.   Crecencio Mc, MD

## 2020-03-29 NOTE — Assessment & Plan Note (Signed)
Referring for sleep study and renal ultrasound, as she is on maximal doses of 4 medications.

## 2020-05-01 ENCOUNTER — Other Ambulatory Visit: Payer: Self-pay | Admitting: Internal Medicine

## 2020-05-23 ENCOUNTER — Other Ambulatory Visit: Payer: Self-pay | Admitting: Internal Medicine

## 2020-06-06 ENCOUNTER — Other Ambulatory Visit: Payer: Self-pay | Admitting: Internal Medicine

## 2020-06-07 ENCOUNTER — Other Ambulatory Visit: Payer: Self-pay

## 2020-06-07 ENCOUNTER — Ambulatory Visit (INDEPENDENT_AMBULATORY_CARE_PROVIDER_SITE_OTHER): Payer: BC Managed Care – PPO

## 2020-06-07 DIAGNOSIS — I159 Secondary hypertension, unspecified: Secondary | ICD-10-CM

## 2020-06-08 NOTE — Progress Notes (Signed)
No evidence of renal artery stenosis as a cause of your high blood pressure .  If your readings are still >130/80 on 3 medications,   you need  to have the sleep study that I previously recommended  to rule out sleep apnea as the cause.  Untreated sleep apnea causes heart failure over time so it should no be left untreated if present.

## 2020-06-20 ENCOUNTER — Other Ambulatory Visit: Payer: Self-pay | Admitting: Internal Medicine

## 2020-07-05 ENCOUNTER — Ambulatory Visit (INDEPENDENT_AMBULATORY_CARE_PROVIDER_SITE_OTHER): Payer: BC Managed Care – PPO | Admitting: Internal Medicine

## 2020-07-05 ENCOUNTER — Other Ambulatory Visit: Payer: Self-pay

## 2020-07-05 ENCOUNTER — Encounter: Payer: Self-pay | Admitting: Internal Medicine

## 2020-07-05 VITALS — BP 120/82 | HR 95 | Temp 97.8°F | Ht 64.49 in | Wt 203.4 lb

## 2020-07-05 DIAGNOSIS — N951 Menopausal and female climacteric states: Secondary | ICD-10-CM

## 2020-07-05 DIAGNOSIS — E042 Nontoxic multinodular goiter: Secondary | ICD-10-CM

## 2020-07-05 DIAGNOSIS — I10 Essential (primary) hypertension: Secondary | ICD-10-CM

## 2020-07-05 DIAGNOSIS — M25511 Pain in right shoulder: Secondary | ICD-10-CM | POA: Diagnosis not present

## 2020-07-05 DIAGNOSIS — G8929 Other chronic pain: Secondary | ICD-10-CM

## 2020-07-05 LAB — TSH: TSH: 1.71 u[IU]/mL (ref 0.35–4.50)

## 2020-07-05 LAB — COMPREHENSIVE METABOLIC PANEL
ALT: 14 U/L (ref 0–35)
AST: 15 U/L (ref 0–37)
Albumin: 4.4 g/dL (ref 3.5–5.2)
Alkaline Phosphatase: 48 U/L (ref 39–117)
BUN: 11 mg/dL (ref 6–23)
CO2: 28 mEq/L (ref 19–32)
Calcium: 9.5 mg/dL (ref 8.4–10.5)
Chloride: 102 mEq/L (ref 96–112)
Creatinine, Ser: 0.9 mg/dL (ref 0.40–1.20)
GFR: 74.77 mL/min (ref >60.00)
Glucose, Bld: 99 mg/dL (ref 70–99)
Potassium: 3.5 mEq/L (ref 3.5–5.1)
Sodium: 139 mEq/L (ref 135–145)
Total Bilirubin: 0.3 mg/dL (ref 0.2–1.2)
Total Protein: 6.7 g/dL (ref 6.0–8.3)

## 2020-07-05 LAB — LIPID PANEL
Cholesterol: 212 mg/dL — ABNORMAL HIGH (ref 0–200)
HDL: 53.2 mg/dL (ref >39.00)
LDL Cholesterol: 142 mg/dL — ABNORMAL HIGH (ref 0–99)
NonHDL: 159.15
Total CHOL/HDL Ratio: 4
Triglycerides: 88 mg/dL (ref 0.0–149.0)
VLDL: 17.6 mg/dL (ref 0.0–40.0)

## 2020-07-05 MED ORDER — VENLAFAXINE HCL ER 37.5 MG PO CP24: 37.5000 mg | ORAL_CAPSULE | Freq: Every day | ORAL | 1 refills | Status: DC

## 2020-07-05 NOTE — Progress Notes (Signed)
Subjective:  Patient ID: Gabrielle Nelson, female    DOB: 01-Oct-1970  Age: 49 y.o. MRN: [id number]  CC: The primary encounter diagnosis was Multiple thyroid nodules. Diagnoses of Essential hypertension, Chronic right shoulder pain, and Perimenopause were also pertinent to this visit.  HPI Gabrielle Nelson presents for follow up on hypertension and obesity  .This visit occurred during the SARS-CoV-2 public health emergency.  Safety protocols were in place, including screening questions prior to the visit, additional usage of staff PPE, and extensive cleaning of exam room while observing appropriate contact time as indicated for disinfecting solutions.   Cc: right shoulder pain . Taking 2 tramadol at night,  And daily meloxicam .  Pain is worse with abduction and radiates from G/H joint to the lateral arm/deltoid region.  No history of fall or unusual activity .  Not improving.    Perimenopausal . Missed 4 periods this year,  None sequentially .  Hot flashes occurring nearly daily .  Feels  Irritable,  Mood labile .discussed starting Effexor   Obesity:  No significant change in weight.  Not particularly concerned about it ,  Has a 45 minute commute to work daily  So not  exercising   Hypertension: finally at goal,  patient is now on  4 medications.  Was sent to Vascular for RAS rule out and no RAS was found  Urged to consider sleep study but defers.  checks blood pressure twice weekly at home.  Readings have been for the most part < 140/80 at rest . Patient is following a reduce salt diet most days and is taking medications as prescribed  Outpatient Medications Prior to Visit  Medication Sig Dispense Refill  . Acetaminophen 500 MG coapsule     . amLODipine (NORVASC) 10 MG tablet TAKE 1 TABLET(10 MG) BY MOUTH DAILY 90 tablet 1  . Cholecalciferol (VITAMIN D3) 1000 units CAPS Take 2,000 Units by mouth daily.    . famotidine (PEPCID) 20 MG tablet TAKE 1 TABLET(20 MG) BY MOUTH TWICE DAILY 180 tablet  1  . fexofenadine (ALLEGRA) 180 MG tablet Take 180 mg by mouth daily.    . fish oil-omega-3 fatty acids 1000 MG capsule Take 1,200 mg by mouth 4 (four) times daily.     Marland Kitchen glucosamine-chondroitin 500-400 MG tablet Take 2 tablets by mouth daily.     Marland Kitchen losartan (COZAAR) 100 MG tablet TAKE 1 TABLET(100 MG) BY MOUTH DAILY 90 tablet 1  . meloxicam (MOBIC) 15 MG tablet TAKE 1 TABLET(15 MG) BY MOUTH DAILY 30 tablet 0  . metoprolol succinate (TOPROL-XL) 25 MG 24 hr tablet Take 1 tablet (25 mg total) by mouth daily. Take with or immediately following a meal. 90 tablet 1  . traMADol (ULTRAM) 50 MG tablet TAKE 1 TABLET(50 MG) BY MOUTH EVERY 6 HOURS AS NEEDED FOR MODERATE PAIN 120 tablet 2  . triamterene-hydrochlorothiazide (MAXZIDE-25) 37.5-25 MG tablet Take 1 tablet by mouth daily. 90 tablet 3  . ALPRAZolam (XANAX) 0.5 MG tablet Take 1 tablet (0.5 mg total) by mouth at bedtime as needed for anxiety or sleep. 30 tablet 1  . furosemide (LASIX) 20 MG tablet Take 1 tablet (20 mg total) by mouth daily. 30 tablet 3  . potassium chloride SA (KLOR-CON) 20 MEQ tablet Take 1 tablet (20 mEq total) by mouth daily. 30 tablet 3   No facility-administered medications prior to visit.    Review of Systems;  Patient denies headache, fevers, malaise, unintentional weight loss, skin rash, eye pain,  sinus congestion and sinus pain, sore throat, dysphagia,  hemoptysis , cough, dyspnea, wheezing, chest pain, palpitations, orthopnea, edema, abdominal pain, nausea, melena, diarrhea, constipation, flank pain, dysuria, hematuria, urinary  Frequency, nocturia, numbness, tingling, seizures,  Focal weakness, Loss of consciousness,  Tremor, insomnia, depression, anxiety, and suicidal ideation.      Objective:  BP 120/82   Pulse 95   Temp 97.8 F (36.6 C) (Oral)   Ht 5' 4.49" (1.638 m)   Wt 203 lb 6.4 oz (92.3 kg)   SpO2 98%   BMI 34.38 kg/m   BP Readings from Last 3 Encounters:  07/05/20 120/82  03/29/20 122/74  01/02/20  126/88    Wt Readings from Last 3 Encounters:  07/05/20 203 lb 6.4 oz (92.3 kg)  03/29/20 201 lb (91.2 kg)  01/02/20 197 lb 12.8 oz (89.7 kg)    General appearance: alert, cooperative and appears stated age Ears: normal TM's and external ear canals both ears Throat: lips, mucosa, and tongue normal; teeth and gums normal Neck: no adenopathy, no carotid bruit, supple, symmetrical, trachea midline and thyroid not enlarged, symmetric, no tenderness/mass/nodules Back: symmetric, no curvature. ROM normal. No CVA tenderness. Lungs: clear to auscultation bilaterally Heart: regular rate and rhythm, S1, S2 normal, no murmur, click, rub or gallop Abdomen: soft, non-tender; bowel sounds normal; no masses,  no organomegaly Pulses: 2+ and symmetric Skin: Skin color, texture, turgor normal. No rashes or lesions Lymph nodes: Cervical, supraclavicular, and axillary nodes normal.  Lab Results  Component Value Date   HGBA1C 5.2 12/03/2019   HGBA1C 5.1 06/19/2018   HGBA1C 5.3 04/05/2016    Lab Results  Component Value Date   CREATININE 0.90 07/05/2020   CREATININE 0.87 01/02/2020   CREATININE 0.81 11/19/2019    Lab Results  Component Value Date   WBC 5.0 01/08/2014   HGB 12.9 01/08/2014   HCT 37.9 01/08/2014   PLT 220.0 01/08/2014   GLUCOSE 99 07/05/2020   CHOL 212 (H) 07/05/2020   TRIG 88.0 07/05/2020   HDL 53.20 07/05/2020   LDLCALC 142 (H) 07/05/2020   ALT 14 07/05/2020   AST 15 07/05/2020   NA 139 07/05/2020   K 3.5 07/05/2020   CL 102 07/05/2020   CREATININE 0.90 07/05/2020   BUN 11 07/05/2020   CO2 28 07/05/2020   TSH 1.71 07/05/2020   HGBA1C 5.2 12/03/2019   MICROALBUR 0.9 11/19/2019    CT SOFT TISSUE NECK W CONTRAST  Result Date: 12/04/2018 CLINICAL DATA:  Mass posterior to the right upper thyroid on outside ultrasound in 10/2018. Chronic right thyroid nodule, previously biopsied. EXAM: CT NECK WITH CONTRAST TECHNIQUE: Multidetector CT imaging of the neck was performed  using the standard protocol following the bolus administration of intravenous contrast. CONTRAST:  1mL OMNIPAQUE IOHEXOL 300 MG/ML  SOLN COMPARISON:  Thyroid ultrasound report 10/30/2018. Thyroid ultrasounds 07/05/2017 and [facility] FINDINGS: Pharynx and larynx: No evidence of pharyngeal mass or swelling. Unremarkable larynx. Patent airway. Salivary glands: No inflammation, mass, or stone. Thyroid: A dominant, solid right upper pole thyroid nodule measures 2.9 cm and has been previously evaluated by ultrasound and biopsy. Just superior to this is a 1.8 x 1.7 x 1.9 cm mass which is homogeneously hypodense but has attenuation greater than water and is without a focal enhancing component. This mass has an interface with the superior thyroid nodule, however it largely appears to be separate from the thyroid. The mass abuts the posterior margin of the right submandibular gland. This mass appears to  correlate with what was labeled as a cystic superior pole thyroid nodule on the 2018 and 2012 studies and has not significantly changed in size. Lymph nodes: No enlarged or suspicious lymph nodes in the neck. Vascular: Major vascular structures of the neck are patent. Limited intracranial: Unremarkable. Visualized orbits: Unremarkable. Mastoids and visualized paranasal sinuses: Moderate right sphenoid sinus mucosal thickening. Clear mastoid air cells. Skeleton: No acute osseous abnormality or suspicious osseous lesion. Upper chest: Clear lung apices. Other: None. IMPRESSION: 1. 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 This is unchanged in size from 2012 and therefore felt to be benign with considerations including a branchial cleft cyst, lymphatic malformation, and possibly exophytic thyroid or parathyroid cyst. No definite enhancing component to indicate a neoplasm. 2. 2.9 cm solid right upper pole thyroid nodule, previously evaluated by ultrasound and biopsy. Electronically Signed   By: Logan Bores M.D.   On: 12/04/2018 09:41    Assessment & Plan:   Problem List Items Addressed This Visit      Unprioritized   Chronic right shoulder pain    Earlier referral to Gen Surg for presumed lipoma was negative for lipoma.  Has  periscapular muscle spasm and lateral upper arm pain with abduction.  Bone spurring vs rotator cuff pathology.  Orhtopedics referral made :        Relevant Medications   venlafaxine XR (EFFEXOR XR) 37.5 MG 24 hr capsule   Other Relevant Orders   Ambulatory referral to Orthopedic Surgery   Essential hypertension    Currently at goal on 4 medications:   maxzide , amlodipine, losartan and metoprolol.  RAS ruled out.  Sleep study recommended but deferred by patient .  She developed hypokalemia on hctz. No changes today  Lab Results  Component Value Date   NA 139 07/05/2020   K 3.5 07/05/2020   CL 102 07/05/2020   CO2 28 07/05/2020         Relevant Orders   Lipid panel (Completed)   Comprehensive metabolic panel (Completed)   Multiple thyroid nodules - Primary   Relevant Orders   TSH (Completed)   Perimenopause    With mood lability, irritability , and hot flashes.  Trial of Effexor.          I have discontinued Monserrat L. Bennick's ALPRAZolam, furosemide, and potassium chloride SA. I am also having her start on venlafaxine XR. Additionally, I am having her maintain her fish oil-omega-3 fatty acids, glucosamine-chondroitin, Vitamin D3, fexofenadine, Acetaminophen, triamterene-hydrochlorothiazide, metoprolol succinate, famotidine, traMADol, losartan, meloxicam, and amLODipine.  Meds ordered this encounter  Medications  . venlafaxine XR (EFFEXOR XR) 37.5 MG 24 hr capsule    Sig: Take 1 capsule (37.5 mg total) by mouth daily.    Dispense:  90 capsule    Refill:  1    Medications Discontinued During This Encounter  Medication Reason  . furosemide (LASIX) 20 MG tablet   . potassium chloride SA (KLOR-CON) 20 MEQ tablet   . ALPRAZolam (XANAX) 0.5 MG  tablet    I provided  30 minutes of  face-to-face time during this encounter reviewing patient's current problems and past surgeries, labs and imaging studies, providing counseling on the above mentioned problems , and coordination  of care .  Follow-up: Return in about 6 months (around 01/02/2021).   Crecencio Mc, MD

## 2020-07-05 NOTE — Patient Instructions (Addendum)
Your blood pressure is finally at goal!  Do not change anything!  Addig Effexor for perimenopause    I will make a referral to Dr Roland Rack for your right shoulder

## 2020-07-06 ENCOUNTER — Other Ambulatory Visit: Payer: Self-pay

## 2020-07-06 DIAGNOSIS — G8929 Other chronic pain: Secondary | ICD-10-CM | POA: Insufficient documentation

## 2020-07-06 DIAGNOSIS — M25511 Pain in right shoulder: Secondary | ICD-10-CM | POA: Insufficient documentation

## 2020-07-06 DIAGNOSIS — N951 Menopausal and female climacteric states: Secondary | ICD-10-CM | POA: Insufficient documentation

## 2020-07-06 NOTE — Progress Notes (Signed)
Your, thyroid ,cholesterol,  liver and kidney function are normal.     Please Plan to repeat fasting labs in 1 year.   Regards,  Dr. Derrel Nip

## 2020-07-06 NOTE — Assessment & Plan Note (Signed)
With mood lability, irritability , and hot flashes.  Trial of Effexor.

## 2020-07-06 NOTE — Assessment & Plan Note (Addendum)
Earlier referral to Gen Surg for presumed lipoma was negative for lipoma.  Has  periscapular muscle spasm and lateral upper arm pain with abduction.  Bone spurring vs rotator cuff pathology.  Orhtopedics referral made :

## 2020-07-06 NOTE — Assessment & Plan Note (Addendum)
Currently at goal on 4 medications:   maxzide , amlodipine, losartan and metoprolol.  RAS ruled out.  Sleep study recommended but deferred by patient .  She developed hypokalemia on hctz. No changes today  Lab Results  Component Value Date   NA 139 07/05/2020   K 3.5 07/05/2020   CL 102 07/05/2020   CO2 28 07/05/2020

## 2020-07-07 MED ORDER — MELOXICAM 15 MG PO TABS
15.0000 mg | ORAL_TABLET | Freq: Every day | ORAL | 0 refills | Status: DC
Start: 2020-07-07 — End: 2020-08-09

## 2020-07-18 ENCOUNTER — Other Ambulatory Visit: Payer: Self-pay | Admitting: Internal Medicine

## 2020-07-19 DIAGNOSIS — M7581 Other shoulder lesions, right shoulder: Secondary | ICD-10-CM | POA: Insufficient documentation

## 2020-08-08 ENCOUNTER — Other Ambulatory Visit: Payer: Self-pay | Admitting: Internal Medicine

## 2020-09-20 ENCOUNTER — Other Ambulatory Visit: Payer: Self-pay | Admitting: Internal Medicine

## 2020-10-06 NOTE — Telephone Encounter (Signed)
Called and spoke to [facility] over patient message. She states that yesterday on 10/05/2020 she has started to experience dizzy spells. They happened while standing at work. Later in the day she went home and was laying in the bed and has experienced another dizzy spell. Patient was concerned and took her blood pressure and it was 116/67. Patient declined an in person visit and requested a phone call from the provider. Patient scheduled a virtual visit for 10/20/2020.

## 2020-10-19 ENCOUNTER — Other Ambulatory Visit: Payer: Self-pay | Admitting: Internal Medicine

## 2020-10-20 ENCOUNTER — Telehealth (INDEPENDENT_AMBULATORY_CARE_PROVIDER_SITE_OTHER): Payer: Self-pay | Admitting: Internal Medicine

## 2020-10-20 ENCOUNTER — Encounter: Payer: Self-pay | Admitting: Internal Medicine

## 2020-10-20 VITALS — BP 124/77 | Ht 64.0 in | Wt 203.0 lb

## 2020-10-20 DIAGNOSIS — G8929 Other chronic pain: Secondary | ICD-10-CM

## 2020-10-20 DIAGNOSIS — N951 Menopausal and female climacteric states: Secondary | ICD-10-CM

## 2020-10-20 DIAGNOSIS — I10 Essential (primary) hypertension: Secondary | ICD-10-CM

## 2020-10-20 DIAGNOSIS — M25561 Pain in right knee: Secondary | ICD-10-CM

## 2020-10-20 NOTE — Progress Notes (Signed)
Virtual Visit via [place]  This visit type was conducted due to national recommendations for restrictions regarding the COVID-19 pandemic (e.g. social distancing).  This format is felt to be most appropriate for this patient at this time.  All issues noted in this document were discussed and addressed.  No physical exam was performed (except for noted visual exam findings with Video Visits).   I connected with@ on 10/20/20 at  4:30 PM EST by a video enabled telemedicine application  and verified that I am speaking with the correct person using two identifiers. Location patient: home Location provider: work or home office Persons participating in the virtual visit: patient, provider  I discussed the limitations, risks, security and privacy concerns of performing an evaluation and management service by telephone and the availability of in person appointments. I also discussed with the patient that there may be a patient responsible charge related to this service. The patient expressed understanding and agreed to proceed.  Reason for visit: follow up on dizziness,  Hypertension and right knee pain   HPI:  49 yr old female with hypertension on 4 medications, ,  Chronic right knee pain , and obesity   Presents for follow up on   1)  dizziness.  Patient states that since discontining the maxzide,  Her dizziness has resolved and her blood pressure has normalized.  Most recent reading is 124/77.  Still taking losartan,  Metoprolol and  Amlodipine.  No lower extremity edema reported despite taking nsaid and amlodipine.   2) Right knee pain;  Getting worse.  Wearing a commercial knee brace at work and Taking tylenol and meloxicam in the morning,  Tylenol and tramadol in the evening  Knee is warm and very painful by end of day .   Not using ice.   Left knee starting to hurt as well due to favoring right knee.  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  GAD:   Stopped the venlafaxine due to  weight gain.    Does not want an alternative currently in spite of increased stress due to her  43 yr old son Celesta Gentile was admitted to Behavioral health last night after telling his younger brother that he was suicidal and had a loaded gun.    ROS: See pertinent positives and negatives per HPI.  Past Medical History:  Diagnosis Date  . Ankle edema, bilateral 03/29/2020  . Hypertension   . Multiple thyroid nodules April  2012    No past surgical history on file.  Family History  Problem Relation Age of Onset  . Atrial fibrillation Father   . Breast cancer Paternal Aunt        2 great PAT  . Cancer Neg Hx     SOCIAL HX:  reports that she quit smoking about 15 years ago. She has never used smokeless tobacco. She reports that she does not drink alcohol and does not use drugs.   Current Outpatient Medications:  .  Acetaminophen 500 MG coapsule, , Disp: , Rfl:  .  amLODipine (NORVASC) 10 MG tablet, TAKE 1 TABLET(10 MG) BY MOUTH DAILY, Disp: 90 tablet, Rfl: 1 .  Cholecalciferol (VITAMIN D3) 1000 units CAPS, Take 2,000 Units by mouth daily., Disp: , Rfl:  .  famotidine (PEPCID) 20 MG tablet, TAKE 1 TABLET(20 MG) BY MOUTH TWICE DAILY, Disp: 180 tablet, Rfl: 1 .  fexofenadine (ALLEGRA) 180 MG tablet, Take 180 mg by mouth daily., Disp: , Rfl:  .  fish oil-omega-3  fatty acids 1000 MG capsule, Take 1,200 mg by mouth 4 (four) times daily., Disp: , Rfl:  .  glucosamine-chondroitin 500-400 MG tablet, Take 2 tablets by mouth daily., Disp: , Rfl:  .  losartan (COZAAR) 100 MG tablet, TAKE 1 TABLET(100 MG) BY MOUTH DAILY, Disp: 90 tablet, Rfl: 1 .  meloxicam (MOBIC) 15 MG tablet, TAKE 1 TABLET(15 MG) BY MOUTH DAILY, Disp: 30 tablet, Rfl: 0 .  metoprolol succinate (TOPROL-XL) 25 MG 24 hr tablet, Take 1 tablet (25 mg total) by mouth daily. Take with or immediately following a meal., Disp: 90 tablet, Rfl: 1 .  traMADol (ULTRAM) 50 MG tablet, TAKE 1 TABLET(50 MG) BY MOUTH EVERY 6 HOURS AS NEEDED FOR  MODERATE PAIN, Disp: 120 tablet, Rfl: 2  EXAM:  VITALS per patient if applicable:  GENERAL: alert, oriented, appears well and in no acute distress  HEENT: atraumatic, conjunttiva clear, no obvious abnormalities on inspection of external nose and ears  NECK: normal movements of the head and neck  LUNGS: on inspection no signs of respiratory distress, breathing rate appears normal, no obvious gross SOB, gasping or wheezing  CV: no obvious cyanosis  MS: moves all visible extremities without noticeable abnormality  PSYCH/NEURO: pleasant and cooperative, no obvious depression or anxiety, speech and thought processing grossly intact  ASSESSMENT AND PLAN:  Discussed the following assessment and plan:  Chronic pain of right knee - Plan: Ambulatory referral to Orthopedic Surgery  Essential hypertension  Perimenopause  Essential hypertension Patient is taking her medications as prescribed and notes no adverse effects since stopping maxzide due to recurrent dizziness .  Home BP readings have been done about once per week and are  generally < 130/80 .  She is avoiding added salt in her diet .  Continue losartan,  Amlodipine and toprol XL  Knee pain Right,  Chronic with no history of trauma.  Referral to Orthopedics for evaluation .  Continue tramadol, tylenol and meloxicam.   Perimenopause With mood lability, irritability , and hot flashes.  Trial of Effexor not tolerated due to weight gain. No alternative requested     I discussed the assessment and treatment plan with the patient. The patient was provided an opportunity to ask questions and all were answered. The patient agreed with the plan and demonstrated an understanding of the instructions.   The patient was advised to call back or seek an in-person evaluation if the symptoms worsen or if the condition fails to improve as anticipated.   I spent 20 minutes dedicated to the care of this patient on the date of this encounter to  include pre-visit review of his medical history,  Face-to-face time with the patient , and post visit ordering of testing and therapeutics.    Crecencio Mc, MD

## 2020-10-21 ENCOUNTER — Encounter: Payer: Self-pay | Admitting: Internal Medicine

## 2020-10-21 NOTE — Assessment & Plan Note (Signed)
With mood lability, irritability , and hot flashes.  Trial of Effexor not tolerated due to weight gain. No alternative requested

## 2020-10-21 NOTE — Assessment & Plan Note (Signed)
Patient is taking her medications as prescribed and notes no adverse effects since stopping maxzide due to recurrent dizziness .  Home BP readings have been done about once per week and are  generally < 130/80 .  She is avoiding added salt in her diet .  Continue losartan,  Amlodipine and toprol XL

## 2020-10-21 NOTE — Assessment & Plan Note (Addendum)
Right,  Chronic with no history of trauma.  Referral to Orthopedics for evaluation .  Continue tramadol, tylenol and meloxicam.

## 2020-11-21 ENCOUNTER — Other Ambulatory Visit: Payer: Self-pay | Admitting: Internal Medicine

## 2020-11-22 ENCOUNTER — Other Ambulatory Visit: Payer: Self-pay

## 2020-11-22 MED ORDER — MELOXICAM 15 MG PO TABS: 15.0000 mg | ORAL_TABLET | Freq: Every day | ORAL | 0 refills | Status: DC

## 2020-11-22 NOTE — Telephone Encounter (Signed)
RX Refill:tramadol Last Seen:10-20-20 Last ordered:05-24-20

## 2020-12-05 ENCOUNTER — Other Ambulatory Visit: Payer: Self-pay | Admitting: Internal Medicine

## 2020-12-19 ENCOUNTER — Other Ambulatory Visit: Payer: Self-pay | Admitting: Internal Medicine

## 2021-01-01 ENCOUNTER — Other Ambulatory Visit: Payer: Self-pay | Admitting: Internal Medicine

## 2021-01-03 ENCOUNTER — Encounter: Payer: Self-pay | Admitting: Internal Medicine

## 2021-01-03 ENCOUNTER — Other Ambulatory Visit: Payer: Self-pay

## 2021-01-03 ENCOUNTER — Ambulatory Visit (INDEPENDENT_AMBULATORY_CARE_PROVIDER_SITE_OTHER): Payer: BC Managed Care – PPO | Admitting: Internal Medicine

## 2021-01-03 VITALS — BP 132/80 | HR 79 | Temp 97.4°F | Resp 15 | Ht 64.0 in | Wt 214.2 lb

## 2021-01-03 DIAGNOSIS — I10 Essential (primary) hypertension: Secondary | ICD-10-CM

## 2021-01-03 DIAGNOSIS — Z87898 Personal history of other specified conditions: Secondary | ICD-10-CM

## 2021-01-03 DIAGNOSIS — N951 Menopausal and female climacteric states: Secondary | ICD-10-CM | POA: Diagnosis not present

## 2021-01-03 LAB — LIPID PANEL
Cholesterol: 216 mg/dL — ABNORMAL HIGH (ref 0–200)
HDL: 54.5 mg/dL (ref >39.00)
LDL Cholesterol: 131 mg/dL — ABNORMAL HIGH (ref 0–99)
NonHDL: 161.34
Total CHOL/HDL Ratio: 4
Triglycerides: 152 mg/dL — ABNORMAL HIGH (ref 0.0–149.0)
VLDL: 30.4 mg/dL (ref 0.0–40.0)

## 2021-01-03 LAB — COMPREHENSIVE METABOLIC PANEL
ALT: 16 U/L (ref 0–35)
AST: 16 U/L (ref 0–37)
Albumin: 4.3 g/dL (ref 3.5–5.2)
Alkaline Phosphatase: 52 U/L (ref 39–117)
BUN: 14 mg/dL (ref 6–23)
CO2: 29 mEq/L (ref 19–32)
Calcium: 9.6 mg/dL (ref 8.4–10.5)
Chloride: 102 mEq/L (ref 96–112)
Creatinine, Ser: 0.86 mg/dL (ref 0.40–1.20)
GFR: 78.69 mL/min (ref >60.00)
Glucose, Bld: 88 mg/dL (ref 70–99)
Potassium: 4.3 mEq/L (ref 3.5–5.1)
Sodium: 139 mEq/L (ref 135–145)
Total Bilirubin: 0.3 mg/dL (ref 0.2–1.2)
Total Protein: 6.6 g/dL (ref 6.0–8.3)

## 2021-01-03 LAB — HEMOGLOBIN A1C: Hgb A1c MFr Bld: 5.5 % (ref 4.6–6.5)

## 2021-01-03 NOTE — Progress Notes (Signed)
Subjective:  Patient ID: Gabrielle Nelson, female    DOB: 1971-06-15  Age: 50 y.o. MRN: [id number]  CC: The primary encounter diagnosis was Essential hypertension. Diagnoses of History of weight gain, Perimenopause, and Morbid obesity ([place]) were also pertinent to this visit.  HPI Gabrielle Nelson presents for 6 MONTH FOLLOW UP ON hypertension, obesity, and chronic joint pain managed with tramadol and mobic   This visit occurred during the SARS-CoV-2 public health emergency.  Safety protocols were in place, including screening questions prior to the visit, additional usage of staff PPE, and extensive cleaning of exam room while observing appropriate contact time as indicated for disinfecting solutions.   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   Commutes one hour each way to work.  Has had a weight gain of 11 lbs since February,  A total of 17 lbs over the past year.  She is not exercising due to knee pain and daily commute.   Her  personal best  197 in April 2021. Blames the venlafaxine that was started for management of perimenopausal symptoms.   Right knee DJD lateral side  Received a  steroid inection march  by [employer] [facility]  Was only mildly successful in reducing pain. Still swells after a full day of work.  MRI ordered by Orthopedics but denied by insurance ("too soon")  Has not been referred for physical therapy   Perimenopause:  Stopped Effexor due to weight gain.  Using estroven instead  With Hot flushes improving in frequency   Hypertension: patient checks blood pressure twice weekly at home.  Readings have been for the most part < 140/80 at rest . Patient is following a reduce salt diet most days and is taking her medications (max doses of losartan,  Amlodipine and 25 mg Toprol daily )  as prescribed   Outpatient Medications Prior to Visit  Medication Sig Dispense Refill  . Acetaminophen 500 MG coapsule     . amLODipine  (NORVASC) 10 MG tablet TAKE 1 TABLET(10 MG) BY MOUTH DAILY 90 tablet 1  . Cholecalciferol (VITAMIN D3) 1000 units CAPS Take 2,000 Units by mouth daily.    . fexofenadine (ALLEGRA) 180 MG tablet Take 180 mg by mouth daily.    . fish oil-omega-3 fatty acids 1000 MG capsule Take 1,200 mg by mouth 4 (four) times daily.    Marland Kitchen glucosamine-chondroitin 500-400 MG tablet Take 2 tablets by mouth daily.    Marland Kitchen losartan (COZAAR) 100 MG tablet TAKE 1 TABLET(100 MG) BY MOUTH DAILY 90 tablet 1  . meloxicam (MOBIC) 15 MG tablet Take 1 tablet (15 mg total) by mouth daily. 30 tablet 0  . metoprolol succinate (TOPROL-XL) 25 MG 24 hr tablet TAKE 1 TABLET(25 MG) BY MOUTH DAILY WITH OR IMMEDIATELY FOLLOWING A MEAL 90 tablet 1  . traMADol (ULTRAM) 50 MG tablet TAKE 1 TABLET(50 MG) BY MOUTH EVERY 6 HOURS AS NEEDED FOR MODERATE PAIN 120 tablet 5  . famotidine (PEPCID) 20 MG tablet TAKE 1 TABLET(20 MG) BY MOUTH TWICE DAILY (Patient not taking: Reported on 01/03/2021) 180 tablet 1   No facility-administered medications prior to visit.    Review of Systems;  Patient denies headache, fevers, malaise, unintentional weight loss, skin rash, eye pain, sinus congestion and sinus pain, sore throat, dysphagia,  hemoptysis , cough, dyspnea, wheezing, chest pain, palpitations, orthopnea, edema, abdominal pain, nausea, melena, diarrhea, constipation, flank pain, dysuria,  hematuria, urinary  Frequency, nocturia, numbness, tingling, seizures,  Focal weakness, Loss of consciousness,  Tremor, insomnia, depression, anxiety, and suicidal ideation.      Objective:  BP 132/80 (BP Location: Left Arm, Patient Position: Sitting, Cuff Size: Large)   Pulse 79   Temp (!) 97.4 F (36.3 C) (Temporal)   Resp 15   Ht 5\' 4"  (1.626 m)   Wt 214 lb 3.2 oz (97.2 kg)   SpO2 97%   BMI 36.77 kg/m   BP Readings from Last 3 Encounters:  01/03/21 132/80  10/20/20 124/77  07/05/20 120/82    Wt Readings from Last 3 Encounters:  01/03/21 214 lb 3.2 oz  (97.2 kg)  10/20/20 203 lb (92.1 kg)  07/05/20 203 lb 6.4 oz (92.3 kg)    General appearance: alert, cooperative and appears stated age Ears: normal TM's and external ear canals both ears Throat: lips, mucosa, and tongue normal; teeth and gums normal Neck: no adenopathy, no carotid bruit, supple, symmetrical, trachea midline and thyroid not enlarged, symmetric, no tenderness/mass/nodules Back: symmetric, no curvature. ROM normal. No CVA tenderness. Lungs: clear to auscultation bilaterally Heart: regular rate and rhythm, S1, S2 normal, no murmur, click, rub or gallop Abdomen: soft, non-tender; bowel sounds normal; no masses,  no organomegaly Pulses: 2+ and symmetric Skin: Skin color, texture, turgor normal. No rashes or lesions Lymph nodes: Cervical, supraclavicular, and axillary nodes normal.  Lab Results  Component Value Date   HGBA1C 5.5 01/03/2021   HGBA1C 5.2 12/03/2019   HGBA1C 5.1 06/19/2018    Lab Results  Component Value Date   CREATININE 0.86 01/03/2021   CREATININE 0.90 07/05/2020   CREATININE 0.87 01/02/2020    Lab Results  Component Value Date   WBC 5.0 01/08/2014   HGB 12.9 01/08/2014   HCT 37.9 01/08/2014   PLT 220.0 01/08/2014   GLUCOSE 88 01/03/2021   CHOL 216 (H) 01/03/2021   TRIG 152.0 (H) 01/03/2021   HDL 54.50 01/03/2021   LDLCALC 131 (H) 01/03/2021   ALT 16 01/03/2021   AST 16 01/03/2021   NA 139 01/03/2021   K 4.3 01/03/2021   CL 102 01/03/2021   CREATININE 0.86 01/03/2021   BUN 14 01/03/2021   CO2 29 01/03/2021   TSH 1.71 07/05/2020   HGBA1C 5.5 01/03/2021   MICROALBUR 0.9 11/19/2019    CT SOFT TISSUE NECK W CONTRAST  Result Date: 12/04/2018 CLINICAL DATA:  Mass posterior to the right upper thyroid on outside ultrasound in 10/2018. Chronic right thyroid nodule, previously biopsied. EXAM: CT NECK WITH CONTRAST TECHNIQUE: Multidetector CT imaging of the neck was performed using the standard protocol following the bolus administration of  intravenous contrast. CONTRAST:  58mL OMNIPAQUE IOHEXOL 300 MG/ML  SOLN COMPARISON:  Thyroid ultrasound report 10/30/2018. Thyroid ultrasounds 07/05/2017 and 12/22/2010 [facility]. FINDINGS: Pharynx and larynx: No evidence of pharyngeal mass or swelling. Unremarkable larynx. Patent airway. Salivary glands: No inflammation, mass, or stone. Thyroid: A dominant, solid right upper pole thyroid nodule measures 2.9 cm and has been previously evaluated by ultrasound and biopsy. Just superior to this is a 1.8 x 1.7 x 1.9 cm mass which is homogeneously hypodense but has attenuation greater than water and is without a focal enhancing component. This mass has an interface with the superior thyroid nodule, however it largely appears to be separate from the thyroid. The mass abuts the posterior margin of the right submandibular gland. This mass appears to correlate with what was labeled as a cystic superior pole thyroid nodule  on the 2018 and 2012 studies and has not significantly changed in size. Lymph nodes: No enlarged or suspicious lymph nodes in the neck. Vascular: Major vascular structures of the neck are patent. Limited intracranial: Unremarkable. Visualized orbits: Unremarkable. Mastoids and visualized paranasal sinuses: Moderate right sphenoid sinus mucosal thickening. Clear mastoid air cells. Skeleton: No acute osseous abnormality or suspicious osseous lesion. Upper chest: Clear lung apices. Other: None. IMPRESSION: 1. 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 This is unchanged in size from 2012 and therefore felt to be benign with considerations including a branchial cleft cyst, lymphatic malformation, and possibly exophytic thyroid or parathyroid cyst. No definite enhancing component to indicate a neoplasm. 2. 2.9 cm solid right upper pole thyroid nodule, previously evaluated by ultrasound and biopsy. Electronically Signed   By: Logan Bores M.D.   On: 12/04/2018 09:41    Assessment & Plan:   Problem  List Items Addressed This Visit      Unprioritized   Perimenopause    Effexor stopped due to weight gain (by patient).  Now using Estroven with moderate results in [place]       Morbid obesity ([place])    Greater than 50% of todays' visit was spent addressing BMI and identifying the  barriers to weight loss. I have recommended that she continue intermittent fastting but to limit her two meals to those with a low glycemic index   I have also recommended that patient ask her orthopedist to refer her for PT on her knee so she can move forward and start exercising . Thyroid function and A1c were checked today and normal.   Lab Results  Component Value Date   HGBA1C 5.5 01/03/2021   Lab Results  Component Value Date   TSH 1.71 07/05/2020         Essential hypertension - Primary    Patient is taking her medications as prescribed and notes no adverse effects since stopping maxzide due to recurrent dizziness .  Home BP readings have been done about once per week and are  generally < 130/80 .  She is avoiding added salt in her diet .  Continue losartan,  Amlodipine and toprol XL  Lab Results  Component Value Date   CREATININE 0.86 01/03/2021   Lab Results  Component Value Date   NA 139 01/03/2021   K 4.3 01/03/2021   CL 102 01/03/2021   CO2 29 01/03/2021         Relevant Orders   Lipid panel (Completed)   Comprehensive metabolic panel (Completed)    Other Visit Diagnoses    History of weight gain       Relevant Orders   Hemoglobin A1c (Completed)     I provided  30 minutes  during this in person encounter reviewing patient's weight history,  Current problems and past surgeries, labs and imaging studies, providing counseling on her obesity and knee pain ,  and coordination  of care .  I have discontinued Gabrielle Nelson's famotidine. I am also having her maintain her fish oil-omega-3 fatty acids, glucosamine-chondroitin, Vitamin D3, fexofenadine, Acetaminophen, traMADol,  meloxicam, metoprolol succinate, amLODipine, and losartan.  No orders of the defined types were placed in this encounter.   Medications Discontinued During This Encounter  Medication Reason  . famotidine (PEPCID) 20 MG tablet     Follow-up: Return in about 6 months (around 07/06/2021).   Crecencio Mc, MD

## 2021-01-03 NOTE — Patient Instructions (Addendum)
Your PAP smear is due NOW.    Follow up with Orthopedics for physical therapy   The new goals for optimal blood pressure management are 120/70.  Please check your blood pressure a few times at home and send me the readings so I can determine if you need a change in medication   I want you to lose weight. It is putting too much stress on your knee and your heart.  (Your personal best was 197 lbs LAST YEAR).YOU CANNOT BLAME EFFEXOR ANYMORE:  WEIGHT LOSS RESULTS WHEN CALORIES IN ARE LESS THAN CALORIES OUT   PORTION REDUCTION AEROBIC EXERCISE (RECUMBENT BIKE)  30 MINUTES 5 DAYS PER WEEK

## 2021-01-04 NOTE — Assessment & Plan Note (Signed)
Patient is taking her medications as prescribed and notes no adverse effects since stopping maxzide due to recurrent dizziness .  Home BP readings have been done about once per week and are  generally < 130/80 .  She is avoiding added salt in her diet .  Continue losartan,  Amlodipine and toprol XL  Lab Results  Component Value Date   CREATININE 0.86 01/03/2021   Lab Results  Component Value Date   NA 139 01/03/2021   K 4.3 01/03/2021   CL 102 01/03/2021   CO2 29 01/03/2021

## 2021-01-04 NOTE — Assessment & Plan Note (Signed)
Effexor stopped due to weight gain (by patient).  Now using Estroven with moderate results in [place]

## 2021-01-04 NOTE — Assessment & Plan Note (Signed)
Greater than 50% of todays' visit was spent addressing BMI and identifying the  barriers to weight loss. I have recommended that she continue intermittent fastting but to limit her two meals to those with a low glycemic index   I have also recommended that patient ask her orthopedist to refer her for PT on her knee so she can move forward and start exercising . Thyroid function and A1c were checked today and normal.   Lab Results  Component Value Date   HGBA1C 5.5 01/03/2021   Lab Results  Component Value Date   TSH 1.71 07/05/2020

## 2021-01-24 ENCOUNTER — Other Ambulatory Visit: Payer: Self-pay | Admitting: Orthopedic Surgery

## 2021-01-24 DIAGNOSIS — M1711 Unilateral primary osteoarthritis, right knee: Secondary | ICD-10-CM

## 2021-01-27 ENCOUNTER — Ambulatory Visit
Admission: RE | Admit: 2021-01-27 | Discharge: 2021-01-27 | Disposition: A | Discharge status: Home / Self Care | Payer: BC Managed Care – PPO | Source: Ambulatory Visit | Attending: Orthopedic Surgery | Admitting: Orthopedic Surgery

## 2021-01-27 ENCOUNTER — Other Ambulatory Visit: Payer: Self-pay

## 2021-01-27 DIAGNOSIS — M1711 Unilateral primary osteoarthritis, right knee: Secondary | ICD-10-CM | POA: Insufficient documentation

## 2021-02-20 ENCOUNTER — Other Ambulatory Visit: Payer: Self-pay | Admitting: Internal Medicine

## 2021-03-12 ENCOUNTER — Other Ambulatory Visit: Payer: Self-pay | Admitting: Internal Medicine

## 2021-04-26 ENCOUNTER — Other Ambulatory Visit: Payer: Self-pay | Admitting: Internal Medicine

## 2021-04-26 ENCOUNTER — Other Ambulatory Visit: Payer: Self-pay

## 2021-04-26 MED ORDER — MELOXICAM 15 MG PO TABS: 15.0000 mg | ORAL_TABLET | Freq: Every day | ORAL | 0 refills | Status: DC

## 2021-05-29 ENCOUNTER — Other Ambulatory Visit: Payer: Self-pay | Admitting: Internal Medicine

## 2021-05-30 NOTE — Telephone Encounter (Signed)
RX Refill:tramadol Last Seen:01-03-21 Last ordered:11-22-20

## 2021-05-31 NOTE — Telephone Encounter (Signed)
REFILLED,  PEASE SCHEDULE HER 6 MONTH FOLLOW UP AT APPROPRIATE TIME

## 2021-06-13 ENCOUNTER — Other Ambulatory Visit: Payer: Self-pay | Admitting: Internal Medicine

## 2021-06-20 ENCOUNTER — Other Ambulatory Visit: Payer: Self-pay | Admitting: Internal Medicine

## 2021-06-20 MED ORDER — VENLAFAXINE HCL ER 75 MG PO CP24: 75.0000 mg | ORAL_CAPSULE | Freq: Every day | ORAL | 2 refills | Status: DC

## 2021-07-06 ENCOUNTER — Ambulatory Visit: Payer: Self-pay | Admitting: Internal Medicine

## 2021-07-15 ENCOUNTER — Telehealth: Payer: Self-pay | Admitting: Internal Medicine

## 2021-07-15 ENCOUNTER — Ambulatory Visit (INDEPENDENT_AMBULATORY_CARE_PROVIDER_SITE_OTHER): Payer: BC Managed Care – PPO | Admitting: Internal Medicine

## 2021-07-15 ENCOUNTER — Other Ambulatory Visit: Payer: Self-pay

## 2021-07-15 ENCOUNTER — Encounter: Payer: Self-pay | Admitting: Internal Medicine

## 2021-07-15 VITALS — BP 130/82 | HR 82 | Temp 97.2°F | Ht 64.0 in | Wt 212.6 lb

## 2021-07-15 DIAGNOSIS — R0683 Snoring: Secondary | ICD-10-CM

## 2021-07-15 DIAGNOSIS — Z6836 Body mass index (BMI) 36.0-36.9, adult: Secondary | ICD-10-CM

## 2021-07-15 DIAGNOSIS — I10 Essential (primary) hypertension: Secondary | ICD-10-CM

## 2021-07-15 DIAGNOSIS — M25569 Pain in unspecified knee: Secondary | ICD-10-CM

## 2021-07-15 DIAGNOSIS — Z1231 Encounter for screening mammogram for malignant neoplasm of breast: Secondary | ICD-10-CM | POA: Diagnosis not present

## 2021-07-15 DIAGNOSIS — G8929 Other chronic pain: Secondary | ICD-10-CM

## 2021-07-15 DIAGNOSIS — E669 Obesity, unspecified: Secondary | ICD-10-CM

## 2021-07-15 LAB — COMPREHENSIVE METABOLIC PANEL
ALT: 21 U/L (ref 0–35)
AST: 20 U/L (ref 0–37)
Albumin: 4.3 g/dL (ref 3.5–5.2)
Alkaline Phosphatase: 66 U/L (ref 39–117)
BUN: 12 mg/dL (ref 6–23)
CO2: 29 mEq/L (ref 19–32)
Calcium: 9.4 mg/dL (ref 8.4–10.5)
Chloride: 104 mEq/L (ref 96–112)
Creatinine, Ser: 0.78 mg/dL (ref 0.40–1.20)
GFR: 88.14 mL/min (ref >60.00)
Glucose, Bld: 103 mg/dL — ABNORMAL HIGH (ref 70–99)
Potassium: 3.9 mEq/L (ref 3.5–5.1)
Sodium: 139 mEq/L (ref 135–145)
Total Bilirubin: 0.4 mg/dL (ref 0.2–1.2)
Total Protein: 6.5 g/dL (ref 6.0–8.3)

## 2021-07-15 NOTE — Assessment & Plan Note (Addendum)
Not at goal in office. She Has not checked BP at home  in a month,  Last reading was "not 130/80 but close" she is  Advised ot contineu workup ofr secondary causes, namely OSA,  ,  With home sleep study which was ordered in July 2021 but never done

## 2021-07-15 NOTE — Patient Instructions (Signed)
Your annual mammogram has been ordered.  Please call [facility] to set up your appointment per their request  [place].  (HOME TEST)  Please substitute 1000 mg tylenol for nighttime tramadol if it is enough .  Tramadol is addicting

## 2021-07-15 NOTE — Progress Notes (Signed)
Subjective:  Patient ID: Gabrielle Nelson, female    DOB: 1971-04-24  Age: 50 y.o. MRN: [id number]  CC: The primary encounter diagnosis was Encounter for screening mammogram for malignant neoplasm of breast. Diagnoses of Essential hypertension, Snoring, Class 2 severe obesity due to excess calories with serious comorbidity and body mass index (BMI) of 36.0 to 36.9 in adult [facility]), Chronic knee pain, unspecified laterality, and Obesity (BMI 30-39.9) were also pertinent to this visit.  HPI Gabrielle Nelson presents for follow up on hypertension and obesity.    This visit occurred during the SARS-CoV-2 public health emergency.  Safety protocols were in place, including screening questions prior to the visit, additional usage of staff PPE, and extensive cleaning of exam room while observing appropriate contact time as indicated for disinfecting solutions.    Hypertension: patient checks blood pressure ONCE A MONTH  at home.  Readings have been for the most part < 140/80 at rest . Patient is following a reduce salt diet most days and is taking medications as prescribed   Right knee pain improving after receiving a Synvisc injection x 1  done  by [place] on June 22 . 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  She is walking more at  work and able to climb one flight of stairs without pain . Using the tramadol  at night to manage back pain. Last refill was Sept 27 for #120   OBESITY;  NO CHANGE IN WEIGHT.  Not exercising . Labs reviewed . Trying to cut carbs  drinking more water   Outpatient Medications Prior to Visit  Medication Sig Dispense Refill   Acetaminophen 500 MG coapsule      amLODipine (NORVASC) 10 MG tablet TAKE 1 TABLET(10 MG) BY MOUTH DAILY 90 tablet 1   Cholecalciferol (VITAMIN D3) 1000 units CAPS Take 2,000 Units by mouth daily.     fexofenadine (ALLEGRA) 180 MG tablet Take 180 mg by  mouth daily.     fish oil-omega-3 fatty acids 1000 MG capsule Take 1,200 mg by mouth 4 (four) times daily.     glucosamine-chondroitin 500-400 MG tablet Take 2 tablets by mouth daily.     losartan (COZAAR) 100 MG tablet TAKE 1 TABLET(100 MG) BY MOUTH DAILY 90 tablet 1   meloxicam (MOBIC) 15 MG tablet Take 1 tablet (15 mg total) by mouth daily. 90 tablet 0   metoprolol succinate (TOPROL-XL) 25 MG 24 hr tablet TAKE 1 TABLET(25 MG) BY MOUTH DAILY WITH OR IMMEDIATELY FOLLOWING A MEAL 90 tablet 1   traMADol (ULTRAM) 50 MG tablet TAKE 1 TABLET(50 MG) BY MOUTH EVERY 6 HOURS AS NEEDED FOR MODERATE PAIN 120 tablet 2   venlafaxine XR (EFFEXOR XR) 75 MG 24 hr capsule Take 1 capsule (75 mg total) by mouth daily with breakfast. 30 capsule 2   No facility-administered medications prior to visit.    Review of Systems;  Patient denies headache, fevers, malaise, unintentional weight loss, skin rash, eye pain, sinus congestion and sinus pain, sore throat, dysphagia,  hemoptysis , cough, dyspnea, wheezing, chest pain, palpitations, orthopnea, edema, abdominal pain, nausea, melena, diarrhea, constipation, flank pain, dysuria, hematuria, urinary  Frequency, nocturia, numbness, tingling, seizures,  Focal weakness, Loss of consciousness,  Tremor, insomnia, depression, anxiety, and suicidal ideation.      Objective:  BP 130/82 (BP Location: Left Arm, Patient Position: Sitting, Cuff Size: Large)   Pulse 82  Temp (!) 97.2 F (36.2 C) (Temporal)   Ht 5\' 4"  (1.626 m)   Wt 212 lb 9.6 oz (96.4 kg)   SpO2 96%   BMI 36.49 kg/m   BP Readings from Last 3 Encounters:  07/15/21 130/82  01/03/21 132/80  10/20/20 124/77    Wt Readings from Last 3 Encounters:  07/15/21 212 lb 9.6 oz (96.4 kg)  01/03/21 214 lb 3.2 oz (97.2 kg)  10/20/20 203 lb (92.1 kg)    General appearance: alert, cooperative and appears stated age Ears: normal TM's and external ear canals both ears Throat: lips, mucosa, and tongue normal;  teeth and gums normal Neck: no adenopathy, no carotid bruit, supple, symmetrical, trachea midline and thyroid not enlarged, symmetric, no tenderness/mass/nodules Back: symmetric, no curvature. ROM normal. No CVA tenderness. Lungs: clear to auscultation bilaterally Heart: regular rate and rhythm, S1, S2 normal, no murmur, click, rub or gallop Abdomen: soft, non-tender; bowel sounds normal; no masses,  no organomegaly Pulses: 2+ and symmetric Skin: Skin color, texture, turgor normal. No rashes or lesions Lymph nodes: Cervical, supraclavicular, and axillary nodes normal.  Lab Results  Component Value Date   HGBA1C 5.5 01/03/2021   HGBA1C 5.2 12/03/2019   HGBA1C 5.1 06/19/2018    Lab Results  Component Value Date   CREATININE 0.78 07/15/2021   CREATININE 0.86 01/03/2021   CREATININE 0.90 07/05/2020    Lab Results  Component Value Date   WBC 5.0 01/08/2014   HGB 12.9 01/08/2014   HCT 37.9 01/08/2014   PLT 220.0 01/08/2014   GLUCOSE 103 (H) 07/15/2021   CHOL 216 (H) 01/03/2021   TRIG 152.0 (H) 01/03/2021   HDL 54.50 01/03/2021   LDLCALC 131 (H) 01/03/2021   ALT 21 07/15/2021   AST 20 07/15/2021   NA 139 07/15/2021   K 3.9 07/15/2021   CL 104 07/15/2021   CREATININE 0.78 07/15/2021   BUN 12 07/15/2021   CO2 29 07/15/2021   TSH 1.71 07/05/2020   HGBA1C 5.5 01/03/2021   MICROALBUR 0.9 11/19/2019    MR KNEE RIGHT WO CONTRAST  Result Date: 01/28/2021 CLINICAL DATA:  Right knee pain for 20 years, slowly worsening over time EXAM: MRI OF THE RIGHT KNEE WITHOUT CONTRAST TECHNIQUE: Multiplanar, multisequence MR imaging of the knee was performed. No intravenous contrast was administered. COMPARISON:  None. FINDINGS: MENISCI Medial: Free edge blunting of the meniscal body.  No displaced tear. Lateral: Free edge blunting of the meniscal body. No displaced tear. LIGAMENTS Cruciates: ACL and PCL are intact. Collaterals: Medial collateral ligament is intact. Lateral collateral ligament  complex is intact. CARTILAGE Patellofemoral: Severe patellar chondrosis with full-thickness cartilage loss along the lateral patellar facet and trochlea with underlying subchondral edema. Medial: Moderate chondrosis with areas of partial thickness chondral loss. Lateral:  Mild chondrosis. JOINT: No significant joint effusion. POPLITEAL FOSSA: Small Baker cyst, extruding along the posterior surface of the medial head gastrocnemius muscle belly. EXTENSOR MECHANISM: Intact quadriceps tendon. Intact patellar tendon. Intact lateral patellar retinaculum. Intact medial patellar retinaculum. Intact MPFL. BONES: No aggressive osseous lesion. No fracture or dislocation. Tricompartment osteophyte formation. Other: No fluid collection or hematoma. Muscles are normal. 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 Moderate medial compartment chondrosis with areas of partial thickness cartilage loss. Mild lateral compartment chondrosis. Degenerative free edge blunting of the medial and lateral meniscal bodies. No displaced meniscal tear. Small Baker cyst. Electronically Signed  By: Maurine Simmering   On: 01/28/2021 10:18    Assessment & Plan:   Problem List Items Addressed This Visit     Obesity (BMI 30-39.9)    Greater than 50% of todays' visit was spent addressing BMI and identifying the  barriers to weight loss. I have recommended that she continue intermittent fastting but to limit her two meals to those with a low glycemic index . 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       Breast cancer screening - Primary   Relevant Orders   MM 3D SCREEN BREAST BILATERAL   Essential hypertension    Not at goal in office. She Has not checked BP at home  in a month,  Last reading was "not 130/80 but  close" she is  Advised ot contineu workup ofr secondary causes, namely OSA,  ,  With home sleep study which was ordered in July 2021 but never done       Relevant Orders   Home sleep test   Comprehensive metabolic panel (Completed)   Ambulatory referral to Sleep Studies   Knee pain    Now resolved.  Last tramadol refill was sept 27 for #120. She is currently using tramadol at night for back pain . She  has been advised to reduce daily use to prn and use tylenol instead      Other Visit Diagnoses     Snoring       Relevant Orders   Home sleep test   Ambulatory referral to Sleep Studies   Class 2 severe obesity due to excess calories with serious comorbidity and body mass index (BMI) of 36.0 to 36.9 in adult [facility])       Relevant Orders   Home sleep test   Ambulatory referral to Sleep Studies       I am having Preslynn L. Lissa Merlin maintain her fish oil-omega-3 fatty acids, glucosamine-chondroitin, Vitamin D3, fexofenadine, Acetaminophen, losartan, meloxicam, traMADol, metoprolol succinate, amLODipine, and venlafaxine XR.  No orders of the defined types were placed in this encounter.   There are no discontinued medications.  Follow-up: No follow-ups on file.   Crecencio Mc, MD

## 2021-07-15 NOTE — Telephone Encounter (Signed)
Pt is returning phone call regarding lab results 

## 2021-07-15 NOTE — Assessment & Plan Note (Signed)
Now resolved.  Last tramadol refill was sept 27 for #120. She is currently using tramadol at night for back pain . She  has been advised to reduce daily use to prn and use tylenol instead

## 2021-07-16 NOTE — Assessment & Plan Note (Addendum)
Greater than 50% of todays' visit was spent addressing BMI and identifying the  barriers to weight loss. I have recommended that she continue intermittent fastting but to limit her two meals to those with a low glycemic index . 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

## 2021-07-17 ENCOUNTER — Other Ambulatory Visit: Payer: Self-pay | Admitting: Internal Medicine

## 2021-07-18 NOTE — Telephone Encounter (Signed)
LMTCB

## 2021-08-08 ENCOUNTER — Other Ambulatory Visit: Payer: Self-pay | Admitting: Internal Medicine

## 2021-08-08 MED ORDER — MELOXICAM 15 MG PO TABS: 15.0000 mg | ORAL_TABLET | Freq: Every day | ORAL | 0 refills | Status: DC

## 2021-08-12 ENCOUNTER — Ambulatory Visit: Payer: BC Managed Care – PPO | Attending: Otolaryngology

## 2021-08-12 DIAGNOSIS — Z6836 Body mass index (BMI) 36.0-36.9, adult: Secondary | ICD-10-CM | POA: Diagnosis not present

## 2021-08-12 DIAGNOSIS — R0683 Snoring: Secondary | ICD-10-CM | POA: Diagnosis present

## 2021-08-12 DIAGNOSIS — I10 Essential (primary) hypertension: Secondary | ICD-10-CM | POA: Insufficient documentation

## 2021-08-23 ENCOUNTER — Encounter: Payer: Self-pay | Admitting: Internal Medicine

## 2021-09-06 ENCOUNTER — Other Ambulatory Visit: Payer: Self-pay | Admitting: Internal Medicine

## 2021-09-20 ENCOUNTER — Other Ambulatory Visit: Payer: Self-pay

## 2021-09-27 ENCOUNTER — Other Ambulatory Visit: Payer: Self-pay | Admitting: Internal Medicine

## 2021-09-27 ENCOUNTER — Ambulatory Visit
Admission: RE | Admit: 2021-09-27 | Discharge: 2021-09-27 | Disposition: A | Discharge status: Home / Self Care | Payer: BC Managed Care – PPO | Source: Ambulatory Visit | Attending: Internal Medicine | Admitting: Internal Medicine

## 2021-09-27 ENCOUNTER — Other Ambulatory Visit: Payer: Self-pay

## 2021-09-27 DIAGNOSIS — Z1231 Encounter for screening mammogram for malignant neoplasm of breast: Secondary | ICD-10-CM | POA: Insufficient documentation

## 2021-09-27 DIAGNOSIS — N6489 Other specified disorders of breast: Secondary | ICD-10-CM

## 2021-09-27 DIAGNOSIS — R928 Other abnormal and inconclusive findings on diagnostic imaging of breast: Secondary | ICD-10-CM

## 2021-09-28 ENCOUNTER — Telehealth: Payer: Self-pay | Admitting: Internal Medicine

## 2021-09-28 DIAGNOSIS — G4733 Obstructive sleep apnea (adult) (pediatric): Secondary | ICD-10-CM | POA: Insufficient documentation

## 2021-09-28 NOTE — Telephone Encounter (Signed)
She has Moderate sleep apnea..  Diagnosis confirmed with recent sleep study done Aug 12 2021.  I have reviewed the  report.  Patient is to be prescribed autotitrating CPAP but not sure what size mask she needs since this was a home study ,  so she was not fitted   Rx printed for CPAP

## 2021-09-28 NOTE — Assessment & Plan Note (Addendum)
Moderate sleep apnea..  Diagnosis confirmed with recent sleep study done Aug 12 2021.  I have reviewed the  report.  Patient is to be prescribe autotitrating CPAP

## 2021-09-29 NOTE — Telephone Encounter (Signed)
LM with [employer] to find out if they know the size of mask and style

## 2021-10-12 ENCOUNTER — Ambulatory Visit
Admission: RE | Admit: 2021-10-12 | Discharge: 2021-10-12 | Disposition: A | Discharge status: Home / Self Care | Payer: BC Managed Care – PPO | Source: Ambulatory Visit | Attending: Internal Medicine | Admitting: Internal Medicine

## 2021-10-12 ENCOUNTER — Other Ambulatory Visit: Payer: Self-pay

## 2021-10-12 DIAGNOSIS — R928 Other abnormal and inconclusive findings on diagnostic imaging of breast: Secondary | ICD-10-CM | POA: Diagnosis not present

## 2021-10-12 DIAGNOSIS — N6489 Other specified disorders of breast: Secondary | ICD-10-CM

## 2021-10-13 ENCOUNTER — Other Ambulatory Visit: Payer: Self-pay | Admitting: Internal Medicine

## 2021-10-13 DIAGNOSIS — R928 Other abnormal and inconclusive findings on diagnostic imaging of breast: Secondary | ICD-10-CM

## 2021-10-13 DIAGNOSIS — N6489 Other specified disorders of breast: Secondary | ICD-10-CM

## 2021-10-14 ENCOUNTER — Encounter: Payer: Self-pay | Admitting: Internal Medicine

## 2021-10-25 ENCOUNTER — Inpatient Hospital Stay: Admission: RE | Admit: 2021-10-25 | Payer: BC Managed Care – PPO | Source: Ambulatory Visit

## 2021-11-03 ENCOUNTER — Ambulatory Visit
Admission: RE | Admit: 2021-11-03 | Discharge: 2021-11-03 | Disposition: A | Discharge status: Home / Self Care | Payer: BC Managed Care – PPO | Source: Ambulatory Visit | Attending: Internal Medicine | Admitting: Internal Medicine

## 2021-11-03 ENCOUNTER — Other Ambulatory Visit: Payer: Self-pay

## 2021-11-03 ENCOUNTER — Other Ambulatory Visit: Payer: Self-pay | Admitting: Internal Medicine

## 2021-11-03 DIAGNOSIS — R928 Other abnormal and inconclusive findings on diagnostic imaging of breast: Secondary | ICD-10-CM

## 2021-11-03 DIAGNOSIS — N6489 Other specified disorders of breast: Secondary | ICD-10-CM

## 2021-11-03 DIAGNOSIS — Z9889 Other specified postprocedural states: Secondary | ICD-10-CM | POA: Diagnosis present

## 2021-11-03 HISTORY — PX: BREAST BIOPSY: SHX20

## 2021-11-04 LAB — SURGICAL PATHOLOGY

## 2021-11-06 DIAGNOSIS — Z9889 Other specified postprocedural states: Secondary | ICD-10-CM | POA: Insufficient documentation

## 2021-12-01 ENCOUNTER — Other Ambulatory Visit: Payer: Self-pay | Admitting: Internal Medicine

## 2021-12-02 ENCOUNTER — Other Ambulatory Visit: Payer: Self-pay | Admitting: Internal Medicine

## 2021-12-02 MED ORDER — MELOXICAM 15 MG PO TABS: 15.0000 mg | ORAL_TABLET | Freq: Every day | ORAL | 0 refills | Status: DC

## 2021-12-05 ENCOUNTER — Other Ambulatory Visit: Payer: Self-pay

## 2021-12-05 MED ORDER — AMLODIPINE BESYLATE 10 MG PO TABS: ORAL_TABLET | ORAL | 1 refills | Status: DC

## 2022-01-13 ENCOUNTER — Ambulatory Visit: Payer: BC Managed Care – PPO | Admitting: Internal Medicine

## 2022-01-13 ENCOUNTER — Encounter: Payer: Self-pay | Admitting: Internal Medicine

## 2022-01-13 VITALS — BP 128/82 | HR 121 | Temp 97.5°F | Ht 64.0 in | Wt 220.4 lb

## 2022-01-13 DIAGNOSIS — E042 Nontoxic multinodular goiter: Secondary | ICD-10-CM

## 2022-01-13 DIAGNOSIS — Z1211 Encounter for screening for malignant neoplasm of colon: Secondary | ICD-10-CM | POA: Diagnosis not present

## 2022-01-13 DIAGNOSIS — I471 Supraventricular tachycardia, unspecified: Secondary | ICD-10-CM

## 2022-01-13 DIAGNOSIS — N951 Menopausal and female climacteric states: Secondary | ICD-10-CM

## 2022-01-13 DIAGNOSIS — N926 Irregular menstruation, unspecified: Secondary | ICD-10-CM | POA: Diagnosis not present

## 2022-01-13 DIAGNOSIS — E669 Obesity, unspecified: Secondary | ICD-10-CM

## 2022-01-13 DIAGNOSIS — M25511 Pain in right shoulder: Secondary | ICD-10-CM

## 2022-01-13 DIAGNOSIS — M25569 Pain in unspecified knee: Secondary | ICD-10-CM

## 2022-01-13 DIAGNOSIS — G8929 Other chronic pain: Secondary | ICD-10-CM

## 2022-01-13 LAB — COMPREHENSIVE METABOLIC PANEL
ALT: 36 U/L — ABNORMAL HIGH (ref 0–35)
AST: 26 U/L (ref 0–37)
Albumin: 4.4 g/dL (ref 3.5–5.2)
Alkaline Phosphatase: 69 U/L (ref 39–117)
BUN: 16 mg/dL (ref 6–23)
CO2: 29 mEq/L (ref 19–32)
Calcium: 9.5 mg/dL (ref 8.4–10.5)
Chloride: 102 mEq/L (ref 96–112)
Creatinine, Ser: 0.95 mg/dL (ref 0.40–1.20)
GFR: 69.33 mL/min (ref >60.00)
Glucose, Bld: 113 mg/dL — ABNORMAL HIGH (ref 70–99)
Potassium: 3.9 mEq/L (ref 3.5–5.1)
Sodium: 140 mEq/L (ref 135–145)
Total Bilirubin: 0.3 mg/dL (ref 0.2–1.2)
Total Protein: 6.6 g/dL (ref 6.0–8.3)

## 2022-01-13 LAB — LIPID PANEL
Cholesterol: 243 mg/dL — ABNORMAL HIGH (ref 0–200)
HDL: 50.6 mg/dL (ref >39.00)
LDL Cholesterol: 160 mg/dL — ABNORMAL HIGH (ref 0–99)
NonHDL: 192.17
Total CHOL/HDL Ratio: 5
Triglycerides: 160 mg/dL — ABNORMAL HIGH (ref 0.0–149.0)
VLDL: 32 mg/dL (ref 0.0–40.0)

## 2022-01-13 LAB — HEMOGLOBIN A1C: Hgb A1c MFr Bld: 5.9 % (ref 4.6–6.5)

## 2022-01-13 LAB — LUTEINIZING HORMONE: LH: 26.92 m[IU]/mL

## 2022-01-13 LAB — FOLLICLE STIMULATING HORMONE: FSH: 35.1 m[IU]/mL

## 2022-01-13 LAB — TSH: TSH: 0.8 u[IU]/mL (ref 0.35–5.50)

## 2022-01-13 NOTE — Progress Notes (Signed)
? ?Subjective:  ?Patient ID: Gabrielle Nelson, female    DOB: 1971-04-19  Age: 51 y.o. MRN: [id number] ? ?CC: The primary encounter diagnosis was Irregular menses. Diagnoses of SVT (supraventricular tachycardia) ([place]), Morbid obesity ([place]), Colon cancer screening, Perimenopause, Obesity (BMI 30-39.9), Multiple thyroid nodules, Paroxysmal supraventricular tachycardia ([place]), Chronic right shoulder pain, and Chronic knee pain, unspecified laterality were also pertinent to this visit. ? ? ?HPI ?Gabrielle Nelson presents for follow up on multiple issues  ?Chief Complaint  ?Patient presents with  ? Follow-up  ? ? ?1) night sweats have been occurring despite keeping room at 65 degrees or less.  Menses have become  irregular ? ?2) tachycardia:  drinks 1 starbucks' coffee in the morning  and a Dr Malachi Bonds after lunch.  No presyncope  or dizziness,  no chest pain  or SOB. ? ?3) Obesity:  no regular exercise . Works in the [employer]  daily.  Has a One hour commute each way.  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  ? ?4) bilateral heel pain :  occurs briefly after resting from a long walk.  Lasts < 1 minute ? ?Outpatient Medications Prior to Visit  ?Medication Sig Dispense Refill  ? Acetaminophen 500 MG coapsule     ? amLODipine (NORVASC) 10 MG tablet TAKE 1 TABLET(10 MG) BY MOUTH DAILY 90 tablet 1  ? Cholecalciferol (VITAMIN D3) 1000 units CAPS Take 2,000 Units by mouth daily.    ? fexofenadine (ALLEGRA) 180 MG tablet Take 180 mg by mouth daily.    ? fish oil-omega-3 fatty acids 1000 MG capsule Take 1,200 mg by mouth 4 (four) times daily.    ? glucosamine-chondroitin 500-400 MG tablet Take 2 tablets by mouth daily.    ? losartan (COZAAR) 100 MG tablet TAKE 1 TABLET(100 MG) BY MOUTH DAILY 90 tablet 1  ? meloxicam (MOBIC) 15 MG tablet Take 1 tablet (15 mg total) by mouth daily. 90 tablet 0  ? metoprolol succinate (TOPROL-XL) 25 MG 24 hr tablet TAKE 1 TABLET(25 MG) BY MOUTH  DAILY WITH OR IMMEDIATELY FOLLOWING A MEAL 90 tablet 1  ? traMADol (ULTRAM) 50 MG tablet TAKE 1 TABLET(50 MG) BY MOUTH EVERY 6 HOURS AS NEEDED FOR MODERATE PAIN 120 tablet 2  ? venlafaxine XR (EFFEXOR-XR) 75 MG 24 hr capsule TAKE 1 CAPSULE(75 MG) BY MOUTH DAILY WITH BREAKFAST 90 capsule 1  ? ?No facility-administered medications prior to visit.  ? ? ?Review of Systems; ? ?Patient denies headache, fevers, malaise, unintentional weight loss, skin rash, eye pain, sinus congestion and sinus pain, sore throat, dysphagia,  hemoptysis , cough, dyspnea, wheezing, chest pain, palpitations, orthopnea, edema, abdominal pain, nausea, melena, diarrhea, constipation, flank pain, dysuria, hematuria, urinary  Frequency, nocturia, numbness, tingling, seizures,  Focal weakness, Loss of consciousness,  Tremor, insomnia, depression, anxiety, and suicidal ideation.   ? ? ? ?Objective:  ?BP 128/82 (BP Location: Left Arm, Patient Position: Sitting, Cuff Size: Small)   Pulse (!) 121   Temp (!) 97.5 ?F (36.4 ?C) (Oral)   Ht '5\' 4"'$  (1.626 m)   Wt 220 lb 6.4 oz (100 kg)   SpO2 97%   BMI 37.83 kg/m?  ? ?BP Readings from Last 3 Encounters:  ?01/13/22 128/82  ?07/15/21 130/82  ?01/03/21 132/80  ? ? ?Wt Readings from Last 3 Encounters:  ?01/13/22 220 lb 6.4 oz (100 kg)  ?07/15/21 212 lb 9.6 oz (96.4 kg)  ?01/03/21 214 lb 3.2 oz (97.2  kg)  ? ? ?General appearance: alert, cooperative and appears stated age ?Ears: normal TM's and external ear canals both ears ?Throat: lips, mucosa, and tongue normal; teeth and gums normal ?Neck: no adenopathy, no carotid bruit, supple, symmetrical, trachea midline and thyroid not enlarged, symmetric, no tenderness/mass/nodules ?Back: symmetric, no curvature. ROM normal. No CVA tenderness. ?Lungs: clear to auscultation bilaterally ?Heart: regular rate and rhythm, S1, S2 normal, no murmur, click, rub or gallop ?Abdomen: soft, non-tender; bowel sounds normal; no masses,  no organomegaly ?Pulses: 2+ and  symmetric ?Skin: Skin color, texture, turgor normal. No rashes or lesions ?Lymph nodes: Cervical, supraclavicular, and axillary nodes normal. ? ?Lab Results  ?Component Value Date  ? HGBA1C 5.9 01/13/2022  ? HGBA1C 5.5 01/03/2021  ? HGBA1C 5.2 12/03/2019  ? ? ?Lab Results  ?Component Value Date  ? CREATININE 0.95 01/13/2022  ? CREATININE 0.78 07/15/2021  ? CREATININE 0.86 01/03/2021  ? ? ?Lab Results  ?Component Value Date  ? WBC 5.0 01/08/2014  ? HGB 12.9 01/08/2014  ? HCT 37.9 01/08/2014  ? PLT 220.0 01/08/2014  ? GLUCOSE 113 (H) 01/13/2022  ? CHOL 243 (H) 01/13/2022  ? TRIG 160.0 (H) 01/13/2022  ? HDL 50.60 01/13/2022  ? LDLCALC 160 (H) 01/13/2022  ? ALT 36 (H) 01/13/2022  ? AST 26 01/13/2022  ? NA 140 01/13/2022  ? K 3.9 01/13/2022  ? CL 102 01/13/2022  ? CREATININE 0.95 01/13/2022  ? BUN 16 01/13/2022  ? CO2 29 01/13/2022  ? TSH 0.80 01/13/2022  ? HGBA1C 5.9 01/13/2022  ? MICROALBUR 0.9 11/19/2019  ? ? ?MM CLIP PLACEMENT LEFT ? ?Result Date: 11/03/2021 ?CLINICAL DATA:  Evaluate biopsy marker EXAM: 3D DIAGNOSTIC LEFT MAMMOGRAM POST STEREOTACTIC BIOPSY COMPARISON:  Previous exam(s). FINDINGS: 3D Mammographic images were obtained following stereotactic guided biopsy of an asymmetry in the upper outer left breast. The biopsy marking clip is in expected position at the site of biopsy. IMPRESSION: Appropriate positioning of the coil shaped biopsy marking clip at the site of biopsy in the upper outer left breast. Final Assessment: Post Procedure Mammograms for Marker Placement Electronically Signed   By: Dorise Bullion III M.D.   On: 11/03/2021 09:25 ? ?MM LT BREAST BX W LOC DEV 1ST LESION IMAGE BX SPEC STEREO GUIDE ? ?Addendum Date: 11/04/2021   ?ADDENDUM REPORT: 11/04/2021 17:59 ADDENDUM: PATHOLOGY revealed: A. LEFT BREAST, UPPER OUTER QUADRANT; STEREOTACTIC BIOPSY: - BENIGN BREAST TISSUE WITH FIBROADENOMATOID CHANGES. - NEGATIVE FOR ATYPIA AND MALIGNANCY. Pathology results are CONCORDANT with imaging findings, per Dr.  Dorise Bullion. Pathology results and recommendations were discussed with patient via telephone on 11/04/2021. Patient reported biopsy site doing well with no adverse symptoms, and only slight tenderness at the site. Post biopsy care instructions were reviewed, questions were answered and my direct phone number was provided. Patient was instructed to call [facility] for any additional questions or concerns related to biopsy site. RECOMMENDATION: Patient instructed to continue monthly self breast examinations and resume annual bilateral screening mammogram due January 2024. Pathology results reported by Electa Sniff RN on 11/04/2021. Electronically Signed   By: Dorise Bullion III M.D.   On: 11/04/2021 17:59  ? ?Result Date: 11/04/2021 ?CLINICAL DATA:  Stereotactic biopsy of a left breast asymmetry EXAM: LEFT BREAST STEREOTACTIC CORE NEEDLE BIOPSY COMPARISON:  Previous exams. FINDINGS: The patient and I discussed the procedure of stereotactic-guided biopsy including benefits and alternatives. We discussed the high likelihood of a successful procedure. We discussed the risks of the procedure including infection,  bleeding, tissue injury, clip migration, and inadequate sampling. Informed written consent was given. The usual time out protocol was performed immediately prior to the procedure. Using sterile technique and 1% Lidocaine as local anesthetic, under stereotactic guidance, a 9 gauge vacuum assisted device was used to perform core needle biopsy of an asymmetry in the upper outer left breast using a lateral approach. Lesion quadrant: Upper outer left breast At the conclusion of the procedure, a coil shaped tissue marker clip was deployed into the biopsy cavity. Follow-up 2-view mammogram was performed and dictated separately. IMPRESSION: Stereotactic-guided biopsy of an asymmetry in the upper outer left breast. No apparent complications. Electronically Signed: By: Dorise Bullion III M.D. On: 11/03/2021  09:24 ? ? ?Assessment & Plan:  ? ?Problem List Items Addressed This Visit   ? ? Chronic right shoulder pain  ?  Secondary to rotator cuff injury ? ?  ?  ? Colon cancer screening  ?  She is overdue; cologuard recommended  ?

## 2022-01-13 NOTE — Patient Instructions (Addendum)
You are overdue for colon cancer screening,  and due for PA P smear now: ? ?1) I recommend Cologuard as your screening test .  PLEASE CONFIRM WITH INSURANCE THEY WILL COVER IT  ? ?2) Please schedule your PAP smear asap  ? ?You are in perimenopause  .  This can last years  !   ? ?The next time you have a "hot flash," check your heart rate.   ? ?Your heel pain is probably from heel spurs.  I advise no treatment unless it becomes more prlonged ? ? ? ? ?

## 2022-01-15 DIAGNOSIS — I471 Supraventricular tachycardia, unspecified: Secondary | ICD-10-CM | POA: Insufficient documentation

## 2022-01-15 DIAGNOSIS — Z1211 Encounter for screening for malignant neoplasm of colon: Secondary | ICD-10-CM | POA: Insufficient documentation

## 2022-01-15 NOTE — Assessment & Plan Note (Signed)
Benign biopsy in 2019 (repeated by [facility]) with annual ultrasound follow up. There is no FH of thyroiD CA ?

## 2022-01-15 NOTE — Assessment & Plan Note (Addendum)
S/o Synvisc injection June 2022. She continues to require use of tramadol at night to manage joint pain. Refill history confirmed via [place] Controlled Substance database, accessed by me today..last refill for #120 lasted nearly 3 months   ?

## 2022-01-15 NOTE — Assessment & Plan Note (Signed)
Secondary to rotator cuff injury ?

## 2022-01-15 NOTE — Assessment & Plan Note (Signed)
She is overdue; cologuard recommended  ?

## 2022-01-15 NOTE — Assessment & Plan Note (Signed)
Her I phone watch has recorded rates of up to 120 bpm hat have been asymptomatic.  I have ordered and reviewed a 12 lead EKG and find that there are no acute changes and patient is in sinus rhythm.   ?

## 2022-01-15 NOTE — Assessment & Plan Note (Addendum)
Greater than 50% of todays' visit was spent addressing BMI and identifying the  barriers to weight loss, which include her 2 hour commute each day ,  her sedentary job , rothopedic issues  ( HISTORYOF RIGHT ROTATOR CUFF TENDONITIS and new onset heel pain).   She has been intermittently fasting and limiting herself to  two meals daily. .  Given her rise in A1c, Will recommend Wegovy at next visit; HOWEVER will recommend discussing with Dr Honor Junes given her MNG  ?

## 2022-01-15 NOTE — Assessment & Plan Note (Signed)
Suggested by [facility],  Night sweats, and irregular menses. ?

## 2022-02-15 LAB — COLOGUARD: COLOGUARD: NEGATIVE

## 2022-02-17 ENCOUNTER — Encounter: Payer: Self-pay | Admitting: Internal Medicine

## 2022-02-17 DIAGNOSIS — M79671 Pain in right foot: Secondary | ICD-10-CM

## 2022-02-20 NOTE — Telephone Encounter (Signed)
Referral has been pended for approval. 

## 2022-03-01 ENCOUNTER — Other Ambulatory Visit: Payer: Self-pay | Admitting: Internal Medicine

## 2022-03-13 ENCOUNTER — Ambulatory Visit: Payer: BC Managed Care – PPO | Admitting: Podiatry

## 2022-03-13 ENCOUNTER — Encounter: Payer: Self-pay | Admitting: Podiatry

## 2022-03-13 ENCOUNTER — Ambulatory Visit (INDEPENDENT_AMBULATORY_CARE_PROVIDER_SITE_OTHER): Payer: BC Managed Care – PPO

## 2022-03-13 DIAGNOSIS — M216X1 Other acquired deformities of right foot: Secondary | ICD-10-CM | POA: Diagnosis not present

## 2022-03-13 DIAGNOSIS — M7750 Other enthesopathy of unspecified foot: Secondary | ICD-10-CM

## 2022-03-13 DIAGNOSIS — M722 Plantar fascial fibromatosis: Secondary | ICD-10-CM

## 2022-03-13 DIAGNOSIS — M7752 Other enthesopathy of left foot: Secondary | ICD-10-CM

## 2022-03-13 DIAGNOSIS — M7751 Other enthesopathy of right foot: Secondary | ICD-10-CM | POA: Diagnosis not present

## 2022-03-13 DIAGNOSIS — M216X2 Other acquired deformities of left foot: Secondary | ICD-10-CM

## 2022-03-13 MED ORDER — METHYLPREDNISOLONE 4 MG PO TBPK: ORAL_TABLET | ORAL | 0 refills | Status: DC

## 2022-03-13 NOTE — Patient Instructions (Signed)

## 2022-03-13 NOTE — Progress Notes (Signed)
  Subjective:  Patient ID: Gabrielle Nelson, female    DOB: 06/25/1971,  MRN: [id number]  Chief Complaint  Patient presents with   Foot Pain    Bilateral heel pain    51 y.o. female presents with the above complaint. History confirmed with patient.  Both the heels are painful the right started and is the worst, the left started after it feels that this may be overcompensating.  Is been going on for several months.  She has tried a foot roller and exercises which she will work for a while.  She has tried over-the-counter inserts as well.  Objective:  Physical Exam: warm, good capillary refill, no trophic changes or ulcerative lesions, normal DP and PT pulses, and normal sensory exam.  Pes cavus foot type with prominent arch Left Foot: point tenderness over the heel pad Right Foot: point tenderness over the heel pad  No images are attached to the encounter.  Radiographs: Multiple views x-ray of both feet: no fracture, dislocation, swelling or degenerative changes noted and right foot has a small plantar calcaneal spur, she has pes cavus alignment Assessment:   1. Plantar fasciitis, bilateral   2. Acquired bilateral pes cavus      Plan:  Patient was evaluated and treated and all questions answered.  Discussed the etiology and treatment options for plantar fasciitis including stretching, formal physical therapy, supportive shoegears such as a running shoe or sneaker, pre fabricated orthoses, injection therapy, and oral medications. We also discussed the role of surgical treatment of this for patients who do not improve after exhausting non-surgical treatment options.   -XR reviewed with patient -Educated patient on stretching and icing of the affected limb -Injection delivered to the plantar fascia of the right foot. -Rx for medrol pack. Educated on use, risks, and benefits of the medication -Recommend custom-molded longitudinal foot orthoses for her pes cavus foot type and Planter  fasciitis think she will need this long-term to offload the strain on the arch and plantar fascia.  She was fitted for these today and we will let her know when they are ready for pickup  After sterile prep with povidone-iodine solution and alcohol, the right heel was injected with 0.5cc 2% xylocaine plain, 0.5cc 0.5% marcaine plain, '5mg'$  triamcinolone acetonide, and '2mg'$  dexamethasone was injected along the medial plantar fascia at the insertion on the plantar calcaneus. The patient tolerated the procedure well without complication.   Return in about 6 weeks (around 04/24/2022) for recheck plantar fasciitis, orthotic pick up .

## 2022-04-24 ENCOUNTER — Encounter: Payer: Self-pay | Admitting: Podiatry

## 2022-04-24 ENCOUNTER — Ambulatory Visit: Payer: BC Managed Care – PPO | Admitting: Podiatry

## 2022-04-24 DIAGNOSIS — M722 Plantar fascial fibromatosis: Secondary | ICD-10-CM

## 2022-04-24 NOTE — Progress Notes (Signed)
  Subjective:  Patient ID: Gabrielle Nelson, female    DOB: [birth year],  MRN: [id number]  Chief Complaint  Patient presents with   Plantar Fasciitis    "It's feeling better.  Some days, it doesn't hurt at all."    51 y.o. female presents with the above complaint. History confirmed with patient.  Both the heels are painful the right started and is the worst, the left started after it feels that this may be overcompensating.  Is been going on for several months.  She has tried a foot roller and exercises which she will work for a while.  She has tried over-the-counter inserts as well.  Interval History: Overall doing fairly well.  She says about 50% better.  She has been doing the therapy exercises routinely twice a day.  The prednisone taper was helpful as well  Objective:  Physical Exam: warm, good capillary refill, no trophic changes or ulcerative lesions, normal DP and PT pulses, and normal sensory exam.  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  Radiographs: Multiple views x-ray of both feet: no fracture, dislocation, swelling or degenerative changes noted and right foot has a small plantar calcaneal spur, she has pes cavus alignment Assessment:   1. Plantar fasciitis, bilateral       Plan:  Patient was evaluated and treated and all questions answered.  Discussed the etiology and treatment options for plantar fasciitis including stretching, formal physical therapy, supportive shoegears such as a running shoe or sneaker, pre fabricated orthoses, injection therapy, and oral medications. We also discussed the role of surgical treatment of this for patients who do not improve after exhausting non-surgical treatment options.   Overall doing much better, she has had about 50 to 60% relief of her plantar fasciitis.   Her orthotics were ready today and they were assessed for size form fit and function. Expect this should resolve in next 4-6 weeks with continued home PT, use of her orthotics. She will let me know how she is doing if she needs further injection or if the orthotics require adjustment    Return if symptoms worsen or fail to improve.

## 2022-05-27 ENCOUNTER — Other Ambulatory Visit: Payer: Self-pay | Admitting: Internal Medicine

## 2022-05-30 ENCOUNTER — Other Ambulatory Visit: Payer: Self-pay | Admitting: Internal Medicine

## 2022-06-06 ENCOUNTER — Other Ambulatory Visit: Payer: Self-pay

## 2022-06-06 MED ORDER — AMLODIPINE BESYLATE 10 MG PO TABS: ORAL_TABLET | ORAL | 1 refills | Status: DC

## 2022-07-03 ENCOUNTER — Encounter (INDEPENDENT_AMBULATORY_CARE_PROVIDER_SITE_OTHER): Payer: Self-pay

## 2022-07-12 ENCOUNTER — Other Ambulatory Visit: Payer: Self-pay | Admitting: Internal Medicine

## 2022-07-17 ENCOUNTER — Ambulatory Visit: Payer: BC Managed Care – PPO | Admitting: Internal Medicine

## 2022-07-31 IMAGING — MG MM BREAST BX W LOC DEV 1ST LESION IMAGE BX SPEC STEREO GUIDE*L*
8 of 9 series · 8 of 17 positions shown · non-contrast
Comparison: Previous exams.
COMPARISON: Previous exams.

Addendum:
CLINICAL DATA: Stereotactic biopsy of a left breast asymmetry

EXAM:
LEFT BREAST STEREOTACTIC CORE NEEDLE BIOPSY

[L (1 of 6)]
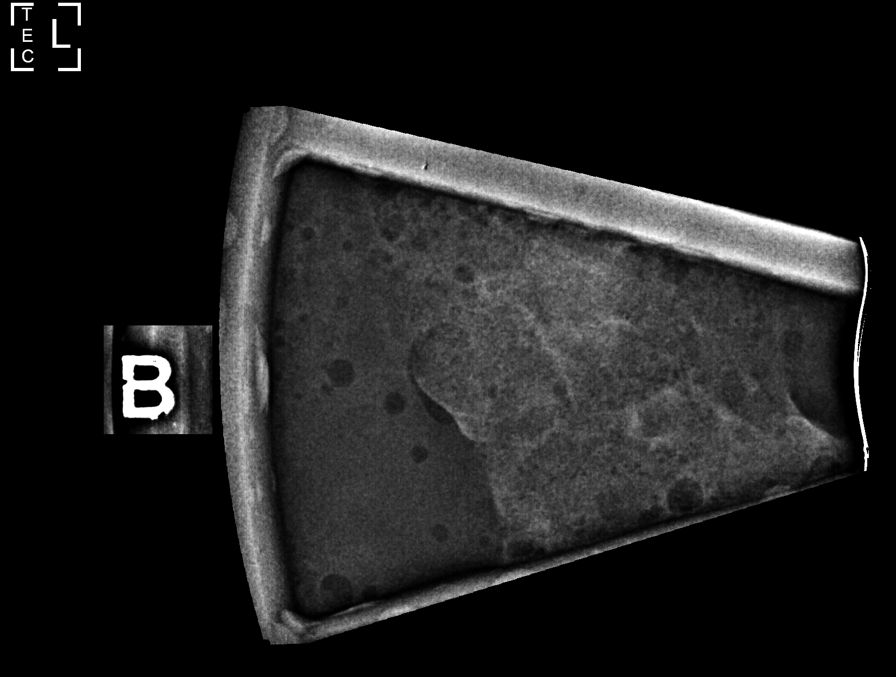

[L (2 of 6)]
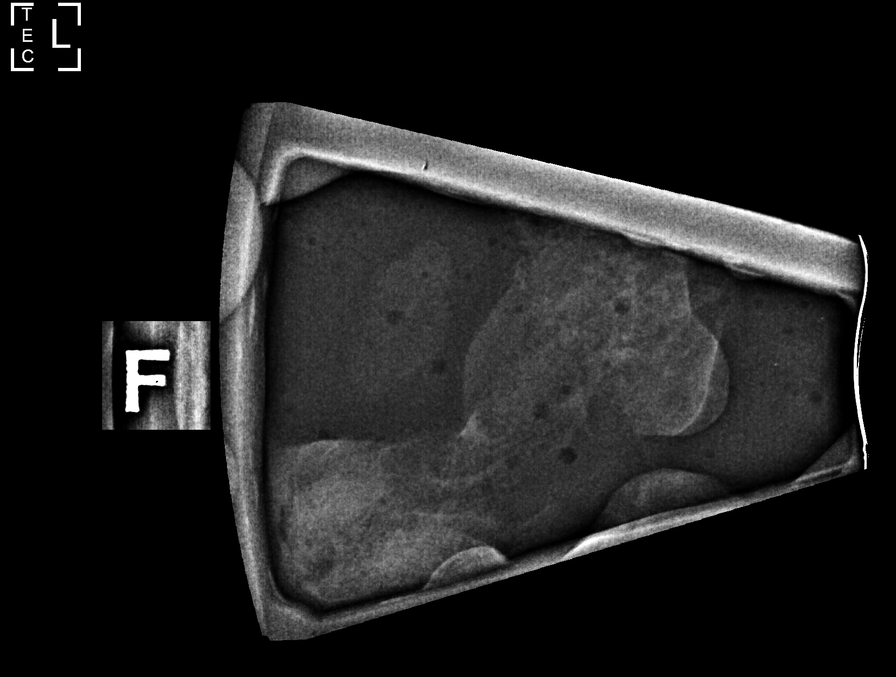

[L (3 of 6)]
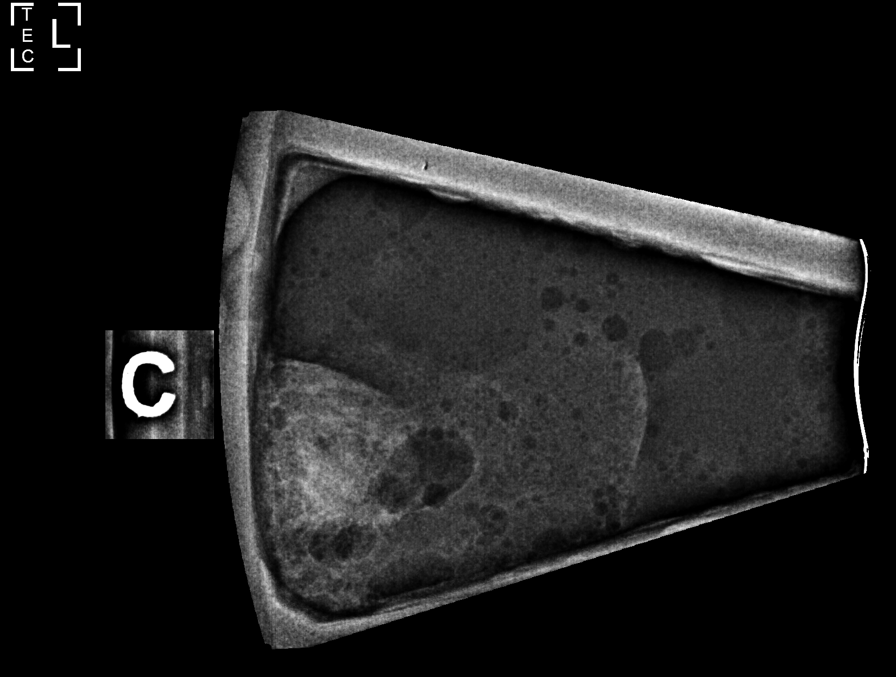

[L (4 of 6)]
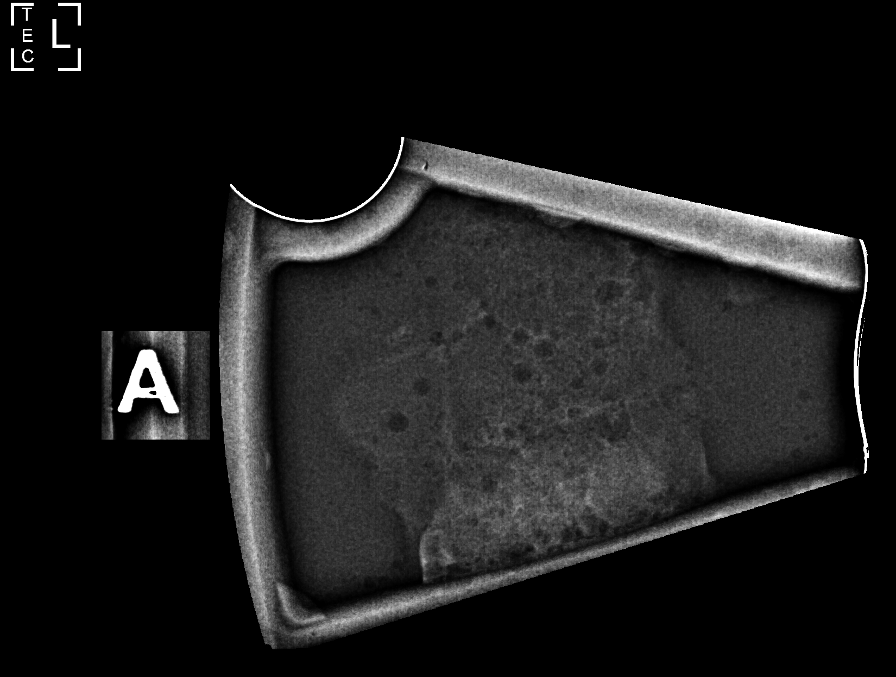

[L (5 of 6)]
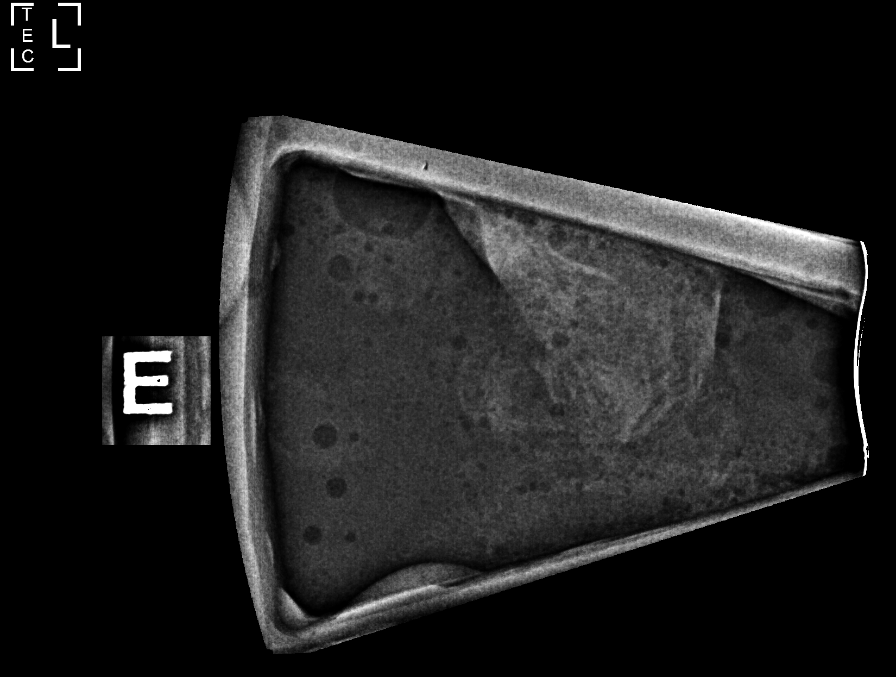

[L (6 of 6)]
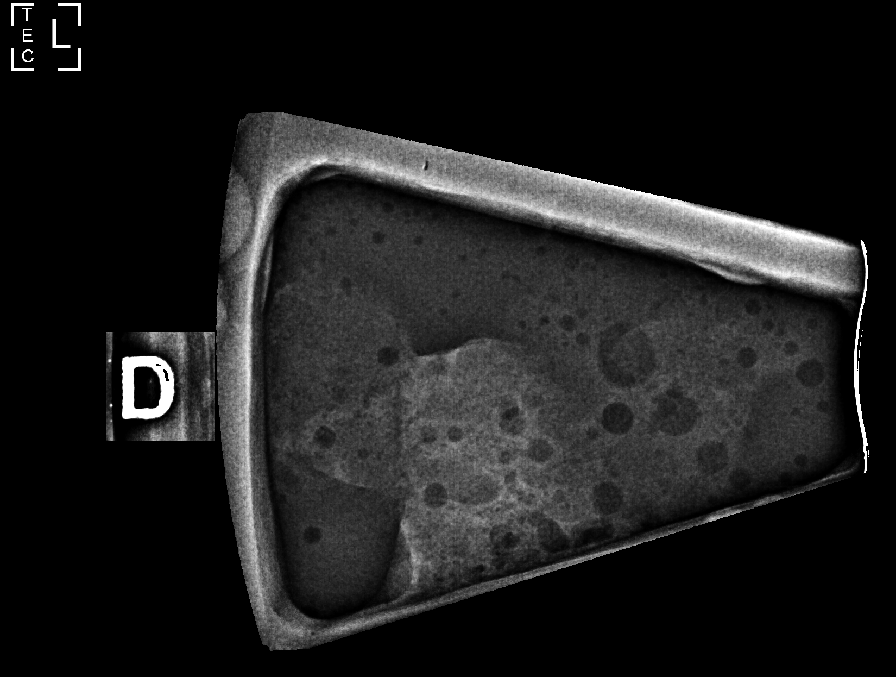

[L LM]
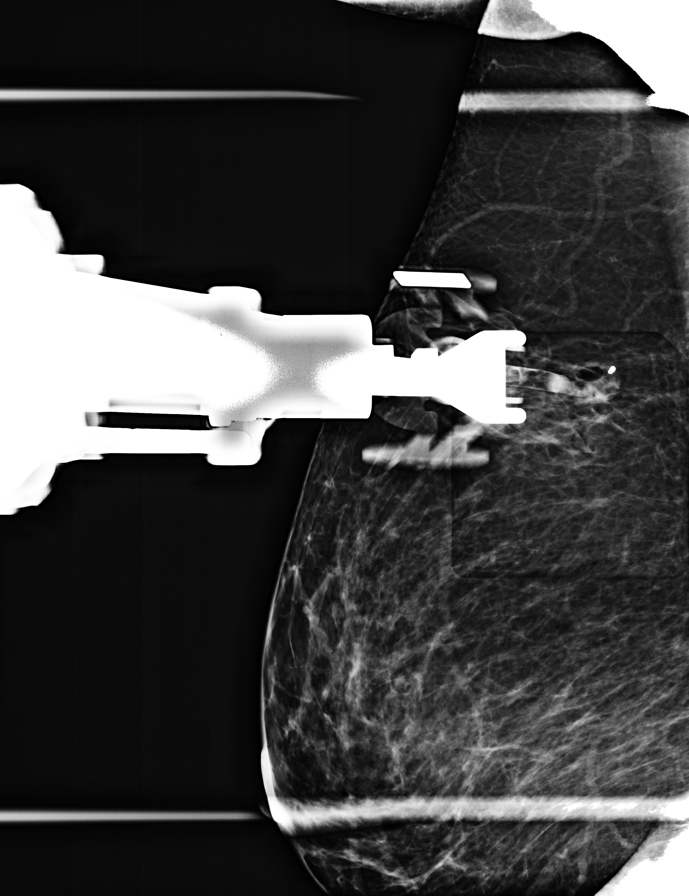

[L LM tomo · tomo slice 44/87.0]
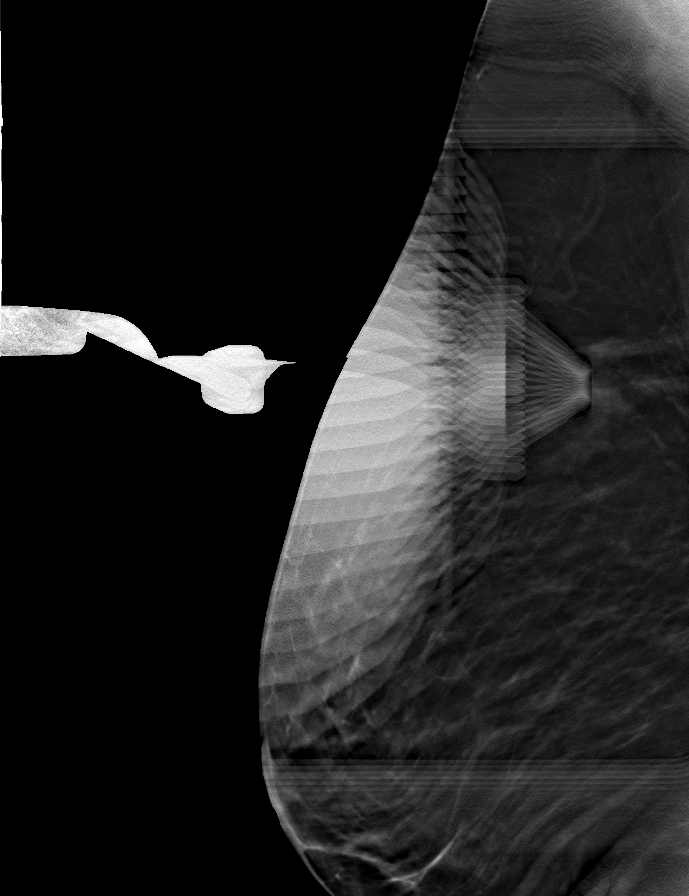

[8 of 17 positions shown; findings below may reference images not displayed]



Using sterile technique and 1% Lidocaine as local anesthetic, under
stereotactic guidance, a 9 gauge vacuum assisted device was used to
perform core needle biopsy of an asymmetry in the upper outer left
breast using a lateral approach.

Lesion quadrant: Upper outer left breast

At the conclusion of the procedure, a coil shaped tissue marker clip
was deployed into the biopsy cavity. Follow-up 2-view mammogram was
performed and dictated separately.
IMPRESSION: Stereotactic-guided biopsy of an asymmetry in the upper outer left
breast. No apparent complications.

ADDENDUM:
PATHOLOGY revealed: A. LEFT BREAST, UPPER OUTER QUADRANT;
STEREOTACTIC BIOPSY: - BENIGN BREAST TISSUE WITH FIBROADENOMATOID
CHANGES. - NEGATIVE FOR ATYPIA AND MALIGNANCY.

Pathology results are CONCORDANT with imaging findings, per Dr.
Jakelin Fatima.

Pathology results and recommendations were discussed with patient
via telephone on 11/04/2021. Patient reported biopsy site doing well
with no adverse symptoms, and only slight tenderness at the site.
Post biopsy care instructions were reviewed, questions were answered
and my direct phone number was provided. Patient was instructed to
call [HOSPITAL] for any additional questions or concerns
related to biopsy site.

RECOMMENDATION: Patient instructed to continue monthly self breast
examinations and resume annual bilateral screening mammogram due
September 2022.

Pathology results reported by Santangelo Riva RN on 11/04/2021.



Using sterile technique and 1% Lidocaine as local anesthetic, under
stereotactic guidance, a 9 gauge vacuum assisted device was used to
perform core needle biopsy of an asymmetry in the upper outer left
breast using a lateral approach.

Lesion quadrant: Upper outer left breast

At the conclusion of the procedure, a coil shaped tissue marker clip
was deployed into the biopsy cavity. Follow-up 2-view mammogram was
performed and dictated separately.
IMPRESSION: Stereotactic-guided biopsy of an asymmetry in the upper outer left
breast. No apparent complications.

## 2022-08-18 ENCOUNTER — Ambulatory Visit: Payer: BC Managed Care – PPO | Admitting: Internal Medicine

## 2022-08-18 ENCOUNTER — Encounter: Payer: Self-pay | Admitting: Internal Medicine

## 2022-08-18 VITALS — BP 122/82 | HR 63 | Temp 97.8°F | Ht 64.0 in | Wt 220.2 lb

## 2022-08-18 DIAGNOSIS — I10 Essential (primary) hypertension: Secondary | ICD-10-CM | POA: Diagnosis not present

## 2022-08-18 DIAGNOSIS — M25569 Pain in unspecified knee: Secondary | ICD-10-CM | POA: Diagnosis not present

## 2022-08-18 DIAGNOSIS — E785 Hyperlipidemia, unspecified: Secondary | ICD-10-CM

## 2022-08-18 DIAGNOSIS — R7303 Prediabetes: Secondary | ICD-10-CM

## 2022-08-18 DIAGNOSIS — G4733 Obstructive sleep apnea (adult) (pediatric): Secondary | ICD-10-CM

## 2022-08-18 DIAGNOSIS — G8929 Other chronic pain: Secondary | ICD-10-CM

## 2022-08-18 DIAGNOSIS — E669 Obesity, unspecified: Secondary | ICD-10-CM

## 2022-08-18 LAB — COMPREHENSIVE METABOLIC PANEL
ALT: 29 U/L (ref 0–35)
AST: 22 U/L (ref 0–37)
Albumin: 4.4 g/dL (ref 3.5–5.2)
Alkaline Phosphatase: 73 U/L (ref 39–117)
BUN: 14 mg/dL (ref 6–23)
CO2: 29 mEq/L (ref 19–32)
Calcium: 9.7 mg/dL (ref 8.4–10.5)
Chloride: 104 mEq/L (ref 96–112)
Creatinine, Ser: 0.84 mg/dL (ref 0.40–1.20)
GFR: 80.03 mL/min (ref >60.00)
Glucose, Bld: 101 mg/dL — ABNORMAL HIGH (ref 70–99)
Potassium: 4.5 mEq/L (ref 3.5–5.1)
Sodium: 140 mEq/L (ref 135–145)
Total Bilirubin: 0.4 mg/dL (ref 0.2–1.2)
Total Protein: 6.6 g/dL (ref 6.0–8.3)

## 2022-08-18 LAB — LIPID PANEL
Cholesterol: 246 mg/dL — ABNORMAL HIGH (ref 0–200)
HDL: 49.1 mg/dL (ref >39.00)
LDL Cholesterol: 168 mg/dL — ABNORMAL HIGH (ref 0–99)
NonHDL: 197.25
Total CHOL/HDL Ratio: 5
Triglycerides: 148 mg/dL (ref 0.0–149.0)
VLDL: 29.6 mg/dL (ref 0.0–40.0)

## 2022-08-18 LAB — MICROALBUMIN / CREATININE URINE RATIO
Creatinine,U: 184.3 mg/dL
Microalb Creat Ratio: 0.6 mg/g (ref 0.0–30.0)
Microalb, Ur: 1.1 mg/dL (ref 0.0–1.9)

## 2022-08-18 LAB — LDL CHOLESTEROL, DIRECT: Direct LDL: 169 mg/dL

## 2022-08-18 LAB — HEMOGLOBIN A1C: Hgb A1c MFr Bld: 5.9 % (ref 4.6–6.5)

## 2022-08-18 MED ORDER — LOSARTAN POTASSIUM 100 MG PO TABS: 100.0000 mg | ORAL_TABLET | Freq: Every day | ORAL | 1 refills | Status: DC

## 2022-08-18 MED ORDER — VENLAFAXINE HCL ER 75 MG PO CP24: ORAL_CAPSULE | ORAL | 1 refills | Status: DC

## 2022-08-18 MED ORDER — MELOXICAM 15 MG PO TABS: ORAL_TABLET | ORAL | 0 refills | Status: DC

## 2022-08-18 NOTE — Patient Instructions (Addendum)
Your sleep test in December was confirmatory of moderate sleep apnea  If you are not contacted in the next 30 days  by [facility] for CPAP , please let me know   All meds have been refilled  Follow up with [place] about your knee  Please consider Weight watchers to help you lose weight

## 2022-08-18 NOTE — Progress Notes (Addendum)
Subjective:  Patient ID: Gabrielle Nelson, female    DOB: [birth year]  Age: 51 y.o. MRN: [id number]  CC: The primary encounter diagnosis was OSA (obstructive sleep apnea). Diagnoses of Essential hypertension, Hyperlipidemia, unspecified hyperlipidemia type, Prediabetes, Chronic knee pain, unspecified laterality, Obesity (BMI 30-39.9), and Morbid obesity ([place]) were also pertinent to this visit.   HPI Gabrielle Nelson presents for MANAGEMENT OF hypertension , obesity, hyperlipidemia, and chronic pain managed with meloxicam tramadol and tylenol  Chief Complaint  Patient presents with   Medical Management of Chronic Issues    Hypertension and discuss pain   1) HTN:  patient is currently prescribed amlodipine 10 mg.   Toprol 25, mg  and losartan 100 mg (all taken once daily ).  Aggravating factors include OSA which was diagnosed by most recent study in  Dec 2022 but has not been treated yet.    2) Chronic pain: involving her right shoulder . And right knee.  Has not seen orthopedics since her first injection of Synvisc  to right knee in June 2022 because of improvement in symptoms.  However, she states that her knee has become more problematic lately, she feels and hears it "pop" when she is staniding and leans back.  The knee is swollen by the end of the day .  Taking 2 tramadol and tylenol at night to help rest , and meloxicam in the morning.   Marland Kitchen    3) Obesity:  does not exercise due to 45 minute commute to [facility] .Diet reviewed:  she either skips breakfast or eats [employer] biscuits . lunch Is hamburger steak with  potato chips, regular soda, sweet tea or water,  occasional dessert ,  dinner is grilled chicken, beans and potatoes   4) OSA:  with Non restorative sleep:  patient was diagnosed with OSA in 2022.  CPAP order was placed  per patient she never received notification .  Apparently the  ORDER WAS never processed.  Her symptoms of daytime fatigue,  poor sleep have progressed and she is  now requesting treatment for OSA.     Outpatient Medications Prior to Visit  Medication Sig Dispense Refill   Acetaminophen 500 MG coapsule      amLODipine (NORVASC) 10 MG tablet TAKE 1 TABLET(10 MG) BY MOUTH DAILY 90 tablet 1   Cholecalciferol (VITAMIN D3) 1000 units CAPS Take 2,000 Units by mouth daily.     fexofenadine (ALLEGRA) 180 MG tablet Take 180 mg by mouth daily.     fish oil-omega-3 fatty acids 1000 MG capsule Take 1,200 mg by mouth 4 (four) times daily.     glucosamine-chondroitin 500-400 MG tablet Take 2 tablets by mouth daily.     metoprolol succinate (TOPROL-XL) 25 MG 24 hr tablet TAKE 1 TABLET(25 MG) BY MOUTH DAILY WITH OR IMMEDIATELY FOLLOWING A MEAL 90 tablet 1   traMADol (ULTRAM) 50 MG tablet TAKE 1 TABLET(50 MG) BY MOUTH EVERY 6 HOURS AS NEEDED FOR MODERATE PAIN 90 tablet 5   losartan (COZAAR) 100 MG tablet TAKE 1 TABLET(100 MG) BY MOUTH DAILY 90 tablet 1   meloxicam (MOBIC) 15 MG tablet TAKE 1 TABLET(15 MG) BY MOUTH DAILY 90 tablet 0   venlafaxine XR (EFFEXOR-XR) 75 MG 24 hr capsule TAKE 1 CAPSULE(75 MG) BY MOUTH DAILY WITH BREAKFAST 90 capsule 1   methylPREDNISolone (MEDROL DOSEPAK) 4 MG TBPK tablet 6 day dose pack - take as directed (Patient not taking: Reported on 04/24/2022) 21 tablet 0   No facility-administered medications  prior to visit.    Review of Systems;  Patient denies headache, fevers, malaise, unintentional weight loss, skin rash, eye pain, sinus congestion and sinus pain, sore throat, dysphagia,  hemoptysis , cough, dyspnea, wheezing, chest pain, palpitations, orthopnea, edema, abdominal pain, nausea, melena, diarrhea, constipation, flank pain, dysuria, hematuria, urinary  Frequency, nocturia, numbness, tingling, seizures,  Focal weakness, Loss of consciousness,  Tremor, insomnia, depression, anxiety, and suicidal ideation.      Objective:  BP 122/82   Pulse 63   Temp 97.8 F (36.6 C) (Oral)   Ht 5' 4"$  (1.626 m)   Wt 220 lb 3.2 oz (99.9 kg)   SpO2  97%   BMI 37.80 kg/m   BP Readings from Last 3 Encounters:  08/18/22 122/82  01/13/22 128/82  07/15/21 130/82    Wt Readings from Last 3 Encounters:  08/18/22 220 lb 3.2 oz (99.9 kg)  01/13/22 220 lb 6.4 oz (100 kg)  07/15/21 212 lb 9.6 oz (96.4 kg)    Physical Exam Vitals reviewed.  Constitutional:      General: She is not in acute distress.    Appearance: Normal appearance. She is normal weight. She is not ill-appearing, toxic-appearing or diaphoretic.  HENT:     Head: Normocephalic.  Eyes:     General: No scleral icterus.       Right eye: No discharge.        Left eye: No discharge.     Conjunctiva/sclera: Conjunctivae normal.  Cardiovascular:     Rate and Rhythm: Normal rate and regular rhythm.     Heart sounds: Normal heart sounds.  Pulmonary:     Effort: Pulmonary effort is normal. No respiratory distress.     Breath sounds: Normal breath sounds.  Musculoskeletal:        General: Swelling present. Normal range of motion.     Comments: Limited to right knee   Skin:    General: Skin is warm and dry.  Neurological:     General: No focal deficit present.     Mental Status: She is alert and oriented to person, place, and time. Mental status is at baseline.  Psychiatric:        Mood and Affect: Mood normal.        Behavior: Behavior normal.        Thought Content: Thought content normal.        Judgment: Judgment normal.     Lab Results  Component Value Date   HGBA1C 5.9 08/18/2022   HGBA1C 5.9 01/13/2022   HGBA1C 5.5 01/03/2021    Lab Results  Component Value Date   CREATININE 0.84 08/18/2022   CREATININE 0.95 01/13/2022   CREATININE 0.78 07/15/2021    Lab Results  Component Value Date   WBC 5.0 01/08/2014   HGB 12.9 01/08/2014   HCT 37.9 01/08/2014   PLT 220.0 01/08/2014   GLUCOSE 101 (H) 08/18/2022   CHOL 246 (H) 08/18/2022   TRIG 148.0 08/18/2022   HDL 49.10 08/18/2022   LDLDIRECT 169.0 08/18/2022   LDLCALC 168 (H) 08/18/2022   ALT 29  08/18/2022   AST 22 08/18/2022   NA 140 08/18/2022   K 4.5 08/18/2022   CL 104 08/18/2022   CREATININE 0.84 08/18/2022   BUN 14 08/18/2022   CO2 29 08/18/2022   TSH 0.80 01/13/2022   HGBA1C 5.9 08/18/2022   MICROALBUR 1.1 08/18/2022    MM LT BREAST BX W LOC DEV 1ST LESION IMAGE BX [place] STEREO GUIDE  Addendum  Date: 11/04/2021   ADDENDUM REPORT: 11/04/2021 17:59 ADDENDUM: PATHOLOGY revealed: A. LEFT BREAST, UPPER OUTER QUADRANT; STEREOTACTIC BIOPSY: - BENIGN BREAST TISSUE WITH FIBROADENOMATOID CHANGES. - NEGATIVE FOR ATYPIA AND MALIGNANCY. Pathology results are CONCORDANT with imaging findings, per Dr. Dorise Bullion. Pathology results and recommendations were discussed with patient via telephone on 11/04/2021. Patient reported biopsy site doing well with no adverse symptoms, and only slight tenderness at the site. Post biopsy care instructions were reviewed, questions were answered and my direct phone number was provided. Patient was instructed to call [facility] for any additional questions or concerns related to biopsy site. RECOMMENDATION: Patient instructed to continue monthly self breast examinations and resume annual bilateral screening mammogram due January 2024. Pathology results reported by Electa Sniff RN on 11/04/2021. Electronically Signed   By: Dorise Bullion III M.D.   On: 11/04/2021 17:59   Result Date: 11/04/2021 CLINICAL DATA:  Stereotactic biopsy of a left breast asymmetry EXAM: LEFT BREAST STEREOTACTIC CORE NEEDLE BIOPSY COMPARISON:  Previous exams. FINDINGS: The patient and I discussed the procedure of stereotactic-guided biopsy including benefits and alternatives. We discussed the high likelihood of a successful procedure. We discussed the risks of the procedure including infection, bleeding, tissue injury, clip migration, and inadequate sampling. Informed written consent was given. The usual time out protocol was performed immediately prior to the procedure. Using sterile  technique and 1% Lidocaine as local anesthetic, under stereotactic guidance, a 9 gauge vacuum assisted device was used to perform core needle biopsy of an asymmetry in the upper outer left breast using a lateral approach. Lesion quadrant: Upper outer left breast At the conclusion of the procedure, a coil shaped tissue marker clip was deployed into the biopsy cavity. Follow-up 2-view mammogram was performed and dictated separately. IMPRESSION: Stereotactic-guided biopsy of an asymmetry in the upper outer left breast. No apparent complications. Electronically Signed: By: Dorise Bullion III M.D. On: 11/03/2021 09:24  MM CLIP PLACEMENT LEFT  Result Date: 11/03/2021 CLINICAL DATA:  Evaluate biopsy marker EXAM: 3D DIAGNOSTIC LEFT MAMMOGRAM POST STEREOTACTIC BIOPSY COMPARISON:  Previous exam(s). FINDINGS: 3D Mammographic images were obtained following stereotactic guided biopsy of an asymmetry in the upper outer left breast. The biopsy marking clip is in expected position at the site of biopsy. IMPRESSION: Appropriate positioning of the coil shaped biopsy marking clip at the site of biopsy in the upper outer left breast. Final Assessment: Post Procedure Mammograms for Marker Placement Electronically Signed   By: Dorise Bullion III M.D.   On: 11/03/2021 09:25   Assessment & Plan:  .OSA (obstructive sleep apnea) Assessment & Plan: Diagnosed by Dec 2022.  CPAP was ordered but  order was never processed.  Reviewed diagnosis with patient today and CPAP reordered through [facility] however she will will need another study due to the lapse in time.    Orders: -     Home sleep test  Essential hypertension Assessment & Plan: Well controlled on current 3 drug  regimen. Renal function stable, no changes today.   Orders: -     Microalbumin / creatinine urine ratio -     Comprehensive metabolic panel  Hyperlipidemia, unspecified hyperlipidemia type Assessment & Plan: 10 yr risk assessment for cardiac events  using  the AHA risk calculator is 2% , and there is no evidence of atherosclerosis by prior imaging.  No treatment required.    Lab Results  Component Value Date   CHOL 246 (H) 08/18/2022   HDL 49.10 08/18/2022   LDLCALC 168 (H)  08/18/2022   LDLDIRECT 169.0 08/18/2022   TRIG 148.0 08/18/2022   CHOLHDL 5 08/18/2022     Orders: -     Lipid panel -     LDL cholesterol, direct  Prediabetes -     Hemoglobin A1c  Chronic knee pain, unspecified laterality Assessment & Plan: Advised to return to orthopedics for follow up and strongly urged to lose weight and begin exercising to strengthen quads.    Obesity (BMI 30-39.9) Assessment & Plan: Greater than 50% of todays' visit was spent addressing BMI and identifying the  barriers to weight loss, which include her 2 hour commute each day ,  her sedentary job , orthopedic issues  ( HISTORYOF RIGHT ROTATOR CUFF TENDONITIS and chronic right knee pain .  I have recommended  a trial of GLP 1 agonist; however, her lack of participation in exercise is likely to limit her success and she has multiple thyroid nodules which increase her risk for thyroid CA.  Recommended Weight Watchers program     Morbid obesity [facility]) Assessment & Plan: Greater than 50% of todays' visit was spent addressing BMI and identifying the  barriers to weight loss. I have recommended that she continue intermittent fastting but to limit her two meals to those with a low glycemic index   I have also recommended that patient return to  her orthopedist for management of her knee so she can move forward and start exercising . Thyroid function and A1c were checked today and normal.   Lab Results  Component Value Date   TSH 0.80 01/13/2022   Lab Results  Component Value Date   HGBA1C 5.9 08/18/2022      Other orders -     Losartan Potassium; Take 1 tablet (100 mg total) by mouth daily.  Dispense: 90 tablet; Refill: 1 -     Meloxicam; TAKE 1 TABLET(15 MG) BY MOUTH DAILY  Dispense:  90 tablet; Refill: 0 -     Venlafaxine HCl ER; TAKE 1 CAPSULE(75 MG) BY MOUTH DAILY WITH BREAKFAST  Dispense: 90 capsule; Refill: 1     I provided 40 minutes of face-to-face time during this encounter reviewing patient's last visit with me, patient's  most recent visit with cardiology,  nephrology,  and neurology,  recent surgical and non surgical procedures, previous  labs and imaging studies, counseling on currently addressed issues,  and post visit ordering to diagnostics and therapeutics .   Follow-up: Return in about 6 months (around 02/17/2023).   Crecencio Mc, MD

## 2022-08-20 NOTE — Assessment & Plan Note (Signed)
Greater than 50% of todays' visit was spent addressing BMI and identifying the  barriers to weight loss. I have recommended that she continue intermittent fastting but to limit her two meals to those with a low glycemic index   I have also recommended that patient return to  her orthopedist for management of her knee so she can move forward and start exercising . Thyroid function and A1c were checked today and normal.   Lab Results  Component Value Date   TSH 0.80 01/13/2022   Lab Results  Component Value Date   HGBA1C 5.9 08/18/2022

## 2022-08-20 NOTE — Assessment & Plan Note (Signed)
10 yr risk assessment for cardiac events  using the AHA risk calculator is 2% , and there is no evidence of atherosclerosis by prior imaging.  No treatment required.    Lab Results  Component Value Date   CHOL 246 (H) 08/18/2022   HDL 49.10 08/18/2022   LDLCALC 168 (H) 08/18/2022   LDLDIRECT 169.0 08/18/2022   TRIG 148.0 08/18/2022   CHOLHDL 5 08/18/2022

## 2022-08-20 NOTE — Assessment & Plan Note (Signed)
Advised to return to orthopedics for follow up and strongly urged to lose weight and begin exercising to strengthen quads.

## 2022-08-20 NOTE — Assessment & Plan Note (Signed)
Well controlled on current 3 drug  regimen. Renal function stable, no changes today.

## 2022-08-20 NOTE — Assessment & Plan Note (Addendum)
Greater than 50% of todays' visit was spent addressing BMI and identifying the  barriers to weight loss, which include her 2 hour commute each day ,  her sedentary job , orthopedic issues  ( HISTORYOF RIGHT ROTATOR CUFF TENDONITIS and chronic right knee pain .  I have recommended  a trial of GLP 1 agonist; however, her lack of participation in exercise is likely to limit her success and she has multiple thyroid nodules which increase her risk for thyroid CA.  Recommended Weight Watchers program

## 2022-08-20 NOTE — Assessment & Plan Note (Addendum)
Diagnosed by Dec 2022.  CPAP was ordered but  order was never processed.  Reviewed diagnosis with patient today and CPAP reordered through [facility] however she will will need another study due to the lapse in time.

## 2022-08-25 ENCOUNTER — Encounter: Payer: Self-pay | Admitting: Internal Medicine

## 2022-08-29 ENCOUNTER — Other Ambulatory Visit: Payer: Self-pay | Admitting: Family

## 2022-08-29 MED ORDER — WEGOVY 0.25 MG/0.5ML ~~LOC~~ SOAJ: 0.2500 mg | SUBCUTANEOUS | 0 refills | Status: DC

## 2022-08-31 ENCOUNTER — Other Ambulatory Visit: Payer: Self-pay | Admitting: Internal Medicine

## 2022-08-31 DIAGNOSIS — Z1231 Encounter for screening mammogram for malignant neoplasm of breast: Secondary | ICD-10-CM

## 2022-09-11 ENCOUNTER — Other Ambulatory Visit (HOSPITAL_COMMUNITY): Payer: Self-pay

## 2022-09-14 ENCOUNTER — Encounter: Payer: Self-pay | Admitting: Internal Medicine

## 2022-09-14 DIAGNOSIS — R7303 Prediabetes: Secondary | ICD-10-CM

## 2022-09-15 MED ORDER — SEMAGLUTIDE-WEIGHT MANAGEMENT 0.25 MG/0.5ML ~~LOC~~ SOAJ: 0.2500 mg | SUBCUTANEOUS | 2 refills | Status: DC

## 2022-09-20 NOTE — Telephone Encounter (Signed)
PA for [facility] is required.

## 2022-09-22 ENCOUNTER — Other Ambulatory Visit (HOSPITAL_COMMUNITY): Payer: Self-pay

## 2022-09-22 ENCOUNTER — Encounter: Payer: Self-pay | Admitting: Internal Medicine

## 2022-09-22 NOTE — Telephone Encounter (Signed)
I'm receiving a paid claim, but it seems like they aren't actually paying anything. Was original PA sent through [facility]? If so, do we have a key number? Thanks

## 2022-09-25 NOTE — Telephone Encounter (Signed)
I haven't submitted a PA on the [facility] for this pt.

## 2022-09-25 NOTE — Telephone Encounter (Signed)
I haven't submitted a PA on the [facility] for this pt.

## 2022-09-26 ENCOUNTER — Other Ambulatory Visit (HOSPITAL_COMMUNITY): Payer: Self-pay

## 2022-09-26 NOTE — Telephone Encounter (Signed)
  I tried to submit a new PA request on [employer] and received this message:   Unfortunately, it looks like the plan is "covering it" but at a very high cost. I would highly recommend that the patient looks for copay cards.

## 2022-10-04 ENCOUNTER — Ambulatory Visit
Admission: RE | Admit: 2022-10-04 | Discharge: 2022-10-04 | Disposition: A | Discharge status: Home / Self Care | Payer: BC Managed Care – PPO | Source: Ambulatory Visit | Attending: Internal Medicine | Admitting: Internal Medicine

## 2022-10-04 DIAGNOSIS — Z1231 Encounter for screening mammogram for malignant neoplasm of breast: Secondary | ICD-10-CM | POA: Diagnosis present

## 2022-10-06 MED ORDER — SAXENDA 18 MG/3ML ~~LOC~~ SOPN: 0.6000 mg | PEN_INJECTOR | Freq: Every day | SUBCUTANEOUS | 0 refills | Status: DC

## 2022-10-06 NOTE — Addendum Note (Signed)
Addended by: Crecencio Mc on: 10/06/2022 11:59 AM   Modules accepted: Orders

## 2022-10-13 NOTE — Addendum Note (Signed)
Addended by: Crecencio Mc on: 10/13/2022 12:30 PM   Modules accepted: Orders

## 2022-11-01 ENCOUNTER — Telehealth: Payer: Self-pay | Admitting: Internal Medicine

## 2022-11-01 ENCOUNTER — Telehealth: Payer: Self-pay

## 2022-11-01 DIAGNOSIS — G4733 Obstructive sleep apnea (adult) (pediatric): Secondary | ICD-10-CM

## 2022-11-01 NOTE — Telephone Encounter (Signed)
LMTCB. Need to schedule pt an appointment to discuss sleep study results per Dr. Lupita Dawn request.

## 2022-11-01 NOTE — Assessment & Plan Note (Signed)
sleep study (home done Feb 19-20 2024)  confirmed that she has  sleep apnea . AHI 14.2 and 8.1   I have ordered the auto titrating CPAP  through [place]

## 2022-11-06 ENCOUNTER — Encounter: Payer: Self-pay | Admitting: Internal Medicine

## 2022-11-06 ENCOUNTER — Ambulatory Visit: Payer: BC Managed Care – PPO | Admitting: Internal Medicine

## 2022-11-06 VITALS — BP 130/82 | HR 79 | Temp 98.4°F | Ht 64.0 in | Wt 223.0 lb

## 2022-11-06 DIAGNOSIS — E669 Obesity, unspecified: Secondary | ICD-10-CM | POA: Diagnosis not present

## 2022-11-06 DIAGNOSIS — N951 Menopausal and female climacteric states: Secondary | ICD-10-CM

## 2022-11-06 DIAGNOSIS — G4733 Obstructive sleep apnea (adult) (pediatric): Secondary | ICD-10-CM

## 2022-11-06 DIAGNOSIS — I471 Supraventricular tachycardia, unspecified: Secondary | ICD-10-CM | POA: Diagnosis not present

## 2022-11-06 MED ORDER — METOPROLOL SUCCINATE ER 25 MG PO TB24: ORAL_TABLET | ORAL | 1 refills | Status: DC

## 2022-11-06 MED ORDER — AMLODIPINE BESYLATE 10 MG PO TABS: ORAL_TABLET | ORAL | 1 refills | Status: DC

## 2022-11-06 MED ORDER — VENLAFAXINE HCL ER 75 MG PO CP24: ORAL_CAPSULE | ORAL | 1 refills | Status: DC

## 2022-11-06 MED ORDER — LOSARTAN POTASSIUM 100 MG PO TABS: 100.0000 mg | ORAL_TABLET | Freq: Every day | ORAL | 1 refills | Status: DC

## 2022-11-06 NOTE — Assessment & Plan Note (Signed)
Suggested by [facility],  Night sweats, and irregular menses.last period Sept 2023

## 2022-11-06 NOTE — Assessment & Plan Note (Signed)
She wants to lose weight,  but has obstacles : feels that mother 's cooking is part of the problem because  she prepares meals that are high calorie

## 2022-11-06 NOTE — Progress Notes (Signed)
Subjective:  Patient ID: Gabrielle Nelson, female    DOB: 1970/12/21  Age: 52 y.o. MRN: [id number]  CC: The primary encounter diagnosis was OSA (obstructive sleep apnea). Diagnoses of Obesity (BMI 30-39.9), Perimenopause, and Paroxysmal supraventricular tachycardia were also pertinent to this visit.   HPI Gabrielle Nelson presents for follow up on home sleep study  Chief Complaint  Patient presents with   discuss sleep study results   OSA:  repeat HH study done Feb 19-20 due to symptoms and signs including hypertension, snoring and obesity . AHI was 14.2 and 8.6 respectively.  With  40 minutes of desats >88% .  PFTs restrictive,  no desats during 6 minute walk  but HR increased to 120 bpm with leisurely walking  She denies hypersomnolence despite having to have a 45 minute drive to and from work .  Daily        Perimenopause sweating  at night  despite using fans. 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  Barriers to weight loss.  She is disinclined to exercise or diet bc she lives with her parents and commutes 1.5 hours daily    Outpatient Medications Prior to Visit  Medication Sig Dispense Refill   Acetaminophen 500 MG coapsule      Cholecalciferol (VITAMIN D3) 1000 units CAPS Take 2,000 Units by mouth daily.     fexofenadine (ALLEGRA) 180 MG tablet Take 180 mg by mouth daily.     fish oil-omega-3 fatty acids 1000 MG capsule Take 1,200 mg by mouth 4 (four) times daily.     glucosamine-chondroitin 500-400 MG tablet Take 2 tablets by mouth daily.     meloxicam (MOBIC) 15 MG tablet TAKE 1 TABLET(15 MG) BY MOUTH DAILY 90 tablet 0   traMADol (ULTRAM) 50 MG tablet TAKE 1 TABLET(50 MG) BY MOUTH EVERY 6 HOURS AS NEEDED FOR MODERATE PAIN 90 tablet 5   amLODipine (NORVASC) 10 MG tablet TAKE 1 TABLET(10 MG) BY MOUTH DAILY 90 tablet 1   losartan (COZAAR) 100 MG tablet Take 1 tablet (100 mg total) by mouth  daily. 90 tablet 1   metoprolol succinate (TOPROL-XL) 25 MG 24 hr tablet TAKE 1 TABLET(25 MG) BY MOUTH DAILY WITH OR IMMEDIATELY FOLLOWING A MEAL 90 tablet 1   venlafaxine XR (EFFEXOR-XR) 75 MG 24 hr capsule TAKE 1 CAPSULE(75 MG) BY MOUTH DAILY WITH BREAKFAST 90 capsule 1   Liraglutide -Weight Management (SAXENDA) 18 MG/3ML SOPN Inject 0.6 mg into the skin daily. Increase dose weekly as follows: Week 2: 1.2 mg daily ; Week 3: 1.8 mg daily; Week 4: 2.4 mg daily (Patient not taking: Reported on 11/06/2022) 15 mL 0   No facility-administered medications prior to visit.    Review of Systems;  Patient denies headache, fevers, malaise, unintentional weight loss, skin rash, eye pain, sinus congestion and sinus pain, sore throat, dysphagia,  hemoptysis , cough, dyspnea, wheezing, chest pain, palpitations, orthopnea, edema, abdominal pain, nausea, melena, diarrhea, constipation, flank pain, dysuria, hematuria, urinary  Frequency, nocturia, numbness, tingling, seizures,  Focal weakness, Loss of consciousness,  Tremor, insomnia, depression, anxiety, and suicidal ideation.      Objective:  BP 130/82   Pulse 79   Temp 98.4 F (36.9 C) (Oral)   Ht '5\' 4"'$  (1.626 m)   Wt 223 lb (101.2 kg)   SpO2 95%   BMI 38.28 kg/m   BP Readings from Last 3 Encounters:  11/06/22 130/82  08/18/22 122/82  01/13/22 128/82    Wt Readings from Last 3 Encounters:  11/06/22 223 lb (101.2 kg)  08/18/22 220 lb 3.2 oz (99.9 kg)  01/13/22 220 lb 6.4 oz (100 kg)    Physical Exam  Lab Results  Component Value Date   HGBA1C 5.9 08/18/2022   HGBA1C 5.9 01/13/2022   HGBA1C 5.5 01/03/2021    Lab Results  Component Value Date   CREATININE 0.84 08/18/2022   CREATININE 0.95 01/13/2022   CREATININE 0.78 07/15/2021    Lab Results  Component Value Date   WBC 5.0 01/08/2014   HGB 12.9 01/08/2014   HCT 37.9 01/08/2014   PLT 220.0 01/08/2014   GLUCOSE 101 (H) 08/18/2022   CHOL 246 (H) 08/18/2022   TRIG 148.0  08/18/2022   HDL 49.10 08/18/2022   LDLDIRECT 169.0 08/18/2022   LDLCALC 168 (H) 08/18/2022   ALT 29 08/18/2022   AST 22 08/18/2022   NA 140 08/18/2022   K 4.5 08/18/2022   CL 104 08/18/2022   CREATININE 0.84 08/18/2022   BUN 14 08/18/2022   CO2 29 08/18/2022   TSH 0.80 01/13/2022   HGBA1C 5.9 08/18/2022   MICROALBUR 1.1 08/18/2022    MM 3D SCREEN BREAST BILATERAL  Result Date: 10/05/2022 CLINICAL DATA:  Screening. EXAM: DIGITAL SCREENING BILATERAL MAMMOGRAM WITH TOMOSYNTHESIS AND CAD TECHNIQUE: Bilateral screening digital craniocaudal and mediolateral oblique mammograms were obtained. Bilateral screening digital breast tomosynthesis was performed. The images were evaluated with computer-aided detection. COMPARISON:  Previous exam(s). ACR Breast Density Category b: There are scattered areas of fibroglandular density. FINDINGS: There are no findings suspicious for malignancy. IMPRESSION: No mammographic evidence of malignancy. A result letter of this screening mammogram will be mailed directly to the patient. RECOMMENDATION: Screening mammogram in one year. (Code:[id number]-B-01Y) BI-RADS CATEGORY  1: Negative. Electronically Signed   By: Claudie Revering M.D.   On: 10/05/2022 12:53    Assessment & Plan:  .OSA (obstructive sleep apnea) Assessment & Plan: She has deferred CPAP for now   Obesity (BMI 30-39.9) Assessment & Plan: She wants to lose weight,  but has obstacles : feels that mother 's cooking is part of the problem because  she prepares meals that are high calorie   Perimenopause Assessment & Plan: Suggested by [facility],  Night sweats, and irregular menses.last period Sept 2023    Paroxysmal supraventricular tachycardia Assessment & Plan: Her I phone watch has recorded rates of up to 120 bpm hat have been asymptomatic.  I have ordered and reviewed a 12 lead EKG and find that there are no acute changes and patient is in sinus rhyth6 minute walk on sleep study report confirms  deconditioning .     Other orders -     amLODIPine Besylate; TAKE 1 TABLET(10 MG) BY MOUTH DAILY  Dispense: 90 tablet; Refill: 1 -     Losartan Potassium; Take 1 tablet (100 mg total) by mouth daily.  Dispense: 90 tablet; Refill: 1 -     Metoprolol Succinate ER; TAKE 1 TABLET(25 MG) BY MOUTH DAILY WITH OR IMMEDIATELY FOLLOWING A MEAL  Dispense: 90 tablet; Refill: 1 -     Venlafaxine HCl ER; TAKE 1 CAPSULE(75 MG) BY MOUTH DAILY WITH BREAKFAST  Dispense: 90 capsule; Refill: 1     I provided 30 minutes of face-to-face time during this encounter reviewing patient's last visit with me, patient's  most recent sleep study   recent surgical and non surgical procedures, previous  labs and imaging studies,  counseling on currently addressed issues,  and post visit ordering to diagnostics and therapeutics .   Follow-up: Return in about 6 months (around 05/09/2023).   Crecencio Mc, MD

## 2022-11-06 NOTE — Assessment & Plan Note (Addendum)
She has deferred CPAP for now

## 2022-11-06 NOTE — Patient Instructions (Addendum)
DR Derrel Nip SAYS YOU HAVE TO LOSE WEIGHT  !:  YOU NEED TO CUT 500 CALORIES FROM YOUR DAILY INTAKE by CHANGING YOUR DIET OR BY START 30 MINUTES OF CONTINUOUS MOVEMENT   MY RECOMMENDATIONS   SKIP THE PINTO BEANS, MAC N CHEESE  AND POTATOES   MEAT , SALAD AND A GREEN VEGETABLE SHOULD BE THE DINNER  BUY BAGGED SALAD INSTEAD (TAYLOR FARMS) ; COMES COMPLETE WITH DRESSING  STOP EATING FAST FOOD AND BURGERS /CHIPS   Healthy Choice "low carb power bowl"  entrees and  "Steamer" entrees are are great low carb entrees that microwave in 5 minutes

## 2022-11-06 NOTE — Assessment & Plan Note (Signed)
Her I phone watch has recorded rates of up to 120 bpm hat have been asymptomatic.  I have ordered and reviewed a 12 lead EKG and find that there are no acute changes and patient is in sinus rhyth6 minute walk on sleep study report confirms deconditioning .

## 2022-11-28 ENCOUNTER — Other Ambulatory Visit: Payer: Self-pay | Admitting: Internal Medicine

## 2022-11-30 NOTE — Telephone Encounter (Signed)
CHART UPDATED

## 2022-12-26 ENCOUNTER — Telehealth: Payer: Self-pay | Admitting: Internal Medicine

## 2022-12-26 NOTE — Telephone Encounter (Signed)
Pt called in staying that she would like to get a referral for a cardiologist. As per pt, because of family history, her dad told her to get check out, like have an echo done, etc?

## 2022-12-27 ENCOUNTER — Other Ambulatory Visit: Payer: Self-pay | Admitting: Internal Medicine

## 2022-12-27 DIAGNOSIS — Z8279 Family history of other congenital malformations, deformations and chromosomal abnormalities: Secondary | ICD-10-CM

## 2022-12-27 NOTE — Telephone Encounter (Signed)
Pt is needing a referral for a cardiologist. Pt's father is Meredyth Hornung and his cardiologist recommended that his children need to be referred for an echo due to family history. The echo is to keep an eye on the bicuspid valve.

## 2022-12-28 NOTE — Telephone Encounter (Signed)
Pt is aware and gave a verbal understanding.  

## 2023-01-07 ENCOUNTER — Other Ambulatory Visit: Payer: Self-pay | Admitting: Internal Medicine

## 2023-01-08 NOTE — Progress Notes (Unsigned)
Cardiology Office Note:    Date:  01/09/2023   ID:  Gabrielle Nelson, DOB 03/04/1971, MRN [id number]  PCP:  Sherlene Shams, MD   [facility] Providers Cardiologist:  None     Referring MD: Sherlene Shams, MD   No chief complaint on file. Family hx of BAV  History of Present Illness:    Gabrielle Nelson is a 52 y.o. female with a hx of HTN, referral for family hx of BAV. No family hx of aortic dissection. 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 She lives in [place]. No prior cardiac w/u. Her father has a BAV. He had a valve replacement in his 49s.  No know dissection. She denies CP or SOB. No palpitations. No syncope. She's had HTN since her 30s. States she takes asa 2/2 family hx of CVA. She follows with Dr. Darrick Huntsman.    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    Current Medications: Current Meds  Medication Sig   Acetaminophen 500 MG coapsule    amLODipine (NORVASC) 10 MG tablet TAKE 1 TABLET(10 MG) BY MOUTH DAILY   aspirin EC 81 MG tablet Take 81 mg by mouth daily. Swallow whole.   Cholecalciferol (VITAMIN D3) 1000 units CAPS Take 2,000 Units by mouth daily.   fexofenadine (ALLEGRA) 180 MG tablet Take 180 mg by mouth daily.   fish oil-omega-3 fatty acids 1000 MG capsule Take 1,200 mg by mouth 4 (four) times daily.   glucosamine-chondroitin 500-400 MG  tablet Take 2 tablets by mouth daily.   losartan (COZAAR) 100 MG tablet Take 1 tablet (100 mg total) by mouth daily.   meloxicam (MOBIC) 15 MG tablet Take 1 tablet (15 mg total) by mouth daily.   metoprolol succinate (TOPROL-XL) 25 MG 24 hr tablet TAKE 1 TABLET(25 MG) BY MOUTH DAILY WITH OR IMMEDIATELY FOLLOWING A MEAL   traMADol (ULTRAM) 50 MG tablet Take 1 tablet (50 mg total) by mouth every six (6) hours as needed for moderate pain max of 3 per day   venlafaxine XR (EFFEXOR-XR) 75 MG 24 hr capsule TAKE 1 CAPSULE(75 MG) BY MOUTH DAILY WITH BREAKFAST     Allergies:   Niacin and related   Social History   Socioeconomic History   Marital status: Single    Spouse name: Not on file   Number of children: Not on file   Years of education: Not on file   Highest education level: Not on file  Occupational History   Occupation: pharmacy tech    Employer: [employer]  Tobacco Use   Smoking status: Former    Types: Cigarettes    Quit date: 08/15/2005    Years since  quitting: 17.4   Smokeless tobacco: Never  Substance and Sexual Activity   Alcohol use: No   Drug use: No   Sexual activity: Not on file  Other Topics Concern   Not on file  Social History Narrative   Not on file   Social Determinants of Health   Financial Resource Strain: Not on file  Food Insecurity: Not on file  Transportation Needs: Not on file  Physical Activity: Not on file  Stress: Not on file  Social Connections: Not on file     Family History: The patient's family history includes Atrial fibrillation in her father; Breast cancer in her paternal aunt. There is no history of Cancer. Father and uncle had a CVA. Father with BAV.   ROS:   Please see the history of present illness.     All other systems reviewed and are negative.  EKGs/Labs/Other Studies Reviewed:    The following studies were reviewed today:   EKG:  EKG is  ordered today.  The ekg ordered today demonstrates   01/09/2023- NSR,  LVH  Recent Labs: 01/13/2022: TSH 0.80 08/18/2022: ALT 29; BUN 14; Creatinine, Ser 0.84; Potassium 4.5; Sodium 140   Recent Lipid Panel    Component Value Date/Time   CHOL 246 (H) 08/18/2022 1108   TRIG 148.0 08/18/2022 1108   HDL 49.10 08/18/2022 1108   CHOLHDL 5 08/18/2022 1108   VLDL 29.6 08/18/2022 1108   LDLCALC 168 (H) 08/18/2022 1108   LDLDIRECT 169.0 08/18/2022 1108     Risk Assessment/Calculations:     Physical Exam:    VS:   Vitals:   01/09/23 0924  BP: 122/76  Pulse: 83     Wt Readings from Last 3 Encounters:  01/09/23 219 lb 3.2 oz (99.4 kg)  11/06/22 223 lb (101.2 kg)  08/18/22 220 lb 3.2 oz (99.9 kg)     GEN:  Well nourished, well developed in no acute distress HEENT: Normal NECK: No JVD; No carotid bruits CARDIAC: RRR, no murmurs, rubs, gallops RESPIRATORY:  Clear to auscultation without rales, wheezing or rhonchi  ABDOMEN: Soft, non-tender, non-distended MUSCULOSKELETAL:  No edema; No deformity  SKIN: Warm and dry NEUROLOGIC:  Alert and oriented x 3 PSYCHIATRIC:  Normal affect   ASSESSMENT:   Family hx of BAV: No murmur or symptoms. Can get a TTE. If resolution challenging will get a CT.  HTN: well controlled. continue current regimen. PLAN:    In order of problems listed above:  TTE Can stop asa 81 mg daily Follow up 1 year       Medication Adjustments/Labs and Tests Ordered: Current medicines are reviewed at length with the patient today.  Concerns regarding medicines are outlined above.  Orders Placed This Encounter  Procedures   EKG 12-Lead   ECHOCARDIOGRAM COMPLETE   No orders of the defined types were placed in this encounter.   Patient Instructions  Medication Instructions:  No changes *If you need a refill on your cardiac medications before your next appointment, please call your pharmacy  Your physician has requested that you have an echocardiogram. Echocardiography is a painless test that uses sound waves to  create images of your heart. It provides your doctor with information about the size and shape of your heart and how well your heart's chambers and valves are working. This procedure takes approximately one hour. There are no restrictions for this procedure. Please do NOT wear cologne, perfume, aftershave, or lotions (deodorant is allowed). Please arrive 15 minutes prior to  your appointment time.   Follow-Up: At [facility], you and your health needs are our priority.  As part of our continuing mission to provide you with exceptional heart care, we have created designated Provider Care Teams.  These Care Teams include your primary Cardiologist (physician) and Advanced Practice Providers (APPs -  Physician Assistants and Nurse Practitioners) who all work together to provide you with the care you need, when you need it.  We recommend signing up for the patient portal called "MyChart".  Sign up information is provided on this After Visit Summary.  MyChart is used to connect with patients for Virtual Visits (Telemedicine).  Patients are able to view lab/test results, encounter notes, upcoming appointments, etc.  Non-urgent messages can be sent to your provider as well.   To learn more about what you can do with MyChart, go to [email or web address].    Your next appointment:   1 year(s)  Provider:   Dr Wyline Mood    Signed, Maisie Fus, MD  01/09/2023 9:53 AM    [facility]

## 2023-01-09 ENCOUNTER — Ambulatory Visit: Payer: BC Managed Care – PPO | Attending: Internal Medicine | Admitting: Internal Medicine

## 2023-01-09 ENCOUNTER — Encounter: Payer: Self-pay | Admitting: Internal Medicine

## 2023-01-09 VITALS — BP 122/76 | HR 83 | Ht 64.0 in | Wt 219.2 lb

## 2023-01-09 DIAGNOSIS — Q231 Congenital insufficiency of aortic valve: Secondary | ICD-10-CM | POA: Diagnosis not present

## 2023-01-09 DIAGNOSIS — Z8279 Family history of other congenital malformations, deformations and chromosomal abnormalities: Secondary | ICD-10-CM

## 2023-01-09 NOTE — Patient Instructions (Signed)
Medication Instructions:  No changes *If you need a refill on your cardiac medications before your next appointment, please call your pharmacy  Your physician has requested that you have an echocardiogram. Echocardiography is a painless test that uses sound waves to create images of your heart. It provides your doctor with information about the size and shape of your heart and how well your heart's chambers and valves are working. This procedure takes approximately one hour. There are no restrictions for this procedure. Please do NOT wear cologne, perfume, aftershave, or lotions (deodorant is allowed). Please arrive 15 minutes prior to your appointment time.   Follow-Up: At [facility], you and your health needs are our priority.  As part of our continuing mission to provide you with exceptional heart care, we have created designated Provider Care Teams.  These Care Teams include your primary Cardiologist (physician) and Advanced Practice Providers (APPs -  Physician Assistants and Nurse Practitioners) who all work together to provide you with the care you need, when you need it.  We recommend signing up for the patient portal called "MyChart".  Sign up information is provided on this After Visit Summary.  MyChart is used to connect with patients for Virtual Visits (Telemedicine).  Patients are able to view lab/test results, encounter notes, upcoming appointments, etc.  Non-urgent messages can be sent to your provider as well.   To learn more about what you can do with MyChart, go to [email or web address].    Your next appointment:   1 year(s)  Provider:   Dr Wyline Mood

## 2023-01-10 ENCOUNTER — Ambulatory Visit: Payer: BC Managed Care – PPO | Attending: Internal Medicine

## 2023-01-10 DIAGNOSIS — Z8279 Family history of other congenital malformations, deformations and chromosomal abnormalities: Secondary | ICD-10-CM | POA: Diagnosis not present

## 2023-01-10 LAB — ECHOCARDIOGRAM COMPLETE
AR max vel: 3.15 cm2
AV Area VTI: 3.2 cm2
AV Area mean vel: 3.18 cm2
AV Mean grad: 4 mmHg
AV Peak grad: 7.3 mmHg
Ao pk vel: 1.35 m/s
Area-P 1/2: 4.96 cm2
Calc EF: 50.6 %
S' Lateral: 3.3 cm
Single Plane A2C EF: 53.5 %
Single Plane A4C EF: 48.5 %

## 2023-02-19 ENCOUNTER — Encounter: Payer: Self-pay | Admitting: Internal Medicine

## 2023-02-19 ENCOUNTER — Ambulatory Visit: Payer: BC Managed Care – PPO | Admitting: Internal Medicine

## 2023-02-19 VITALS — BP 128/78 | HR 83 | Temp 97.4°F | Ht 64.0 in | Wt 216.8 lb

## 2023-02-19 DIAGNOSIS — E876 Hypokalemia: Secondary | ICD-10-CM

## 2023-02-19 DIAGNOSIS — Z124 Encounter for screening for malignant neoplasm of cervix: Secondary | ICD-10-CM

## 2023-02-19 DIAGNOSIS — Z23 Encounter for immunization: Secondary | ICD-10-CM | POA: Diagnosis not present

## 2023-02-19 DIAGNOSIS — G4733 Obstructive sleep apnea (adult) (pediatric): Secondary | ICD-10-CM

## 2023-02-19 DIAGNOSIS — E7800 Pure hypercholesterolemia, unspecified: Secondary | ICD-10-CM

## 2023-02-19 DIAGNOSIS — E269 Hyperaldosteronism, unspecified: Secondary | ICD-10-CM

## 2023-02-19 DIAGNOSIS — I471 Supraventricular tachycardia, unspecified: Secondary | ICD-10-CM

## 2023-02-19 DIAGNOSIS — E042 Nontoxic multinodular goiter: Secondary | ICD-10-CM | POA: Diagnosis not present

## 2023-02-19 DIAGNOSIS — I10 Essential (primary) hypertension: Secondary | ICD-10-CM | POA: Diagnosis not present

## 2023-02-19 LAB — MICROALBUMIN / CREATININE URINE RATIO
Creatinine,U: 182.1 mg/dL
Microalb Creat Ratio: 0.9 mg/g (ref 0.0–30.0)
Microalb, Ur: 1.7 mg/dL (ref 0.0–1.9)

## 2023-02-19 LAB — LIPID PANEL
Cholesterol: 235 mg/dL — ABNORMAL HIGH (ref 0–200)
HDL: 50.6 mg/dL (ref >39.00)
LDL Cholesterol: 153 mg/dL — ABNORMAL HIGH (ref 0–99)
NonHDL: 184.44
Total CHOL/HDL Ratio: 5
Triglycerides: 159 mg/dL — ABNORMAL HIGH (ref 0.0–149.0)
VLDL: 31.8 mg/dL (ref 0.0–40.0)

## 2023-02-19 LAB — COMPREHENSIVE METABOLIC PANEL
ALT: 31 U/L (ref 0–35)
AST: 27 U/L (ref 0–37)
Albumin: 4.5 g/dL (ref 3.5–5.2)
Alkaline Phosphatase: 81 U/L (ref 39–117)
BUN: 12 mg/dL (ref 6–23)
CO2: 30 mEq/L (ref 19–32)
Calcium: 9.6 mg/dL (ref 8.4–10.5)
Chloride: 102 mEq/L (ref 96–112)
Creatinine, Ser: 0.82 mg/dL (ref 0.40–1.20)
GFR: 82.08 mL/min (ref >60.00)
Glucose, Bld: 104 mg/dL — ABNORMAL HIGH (ref 70–99)
Potassium: 3.4 mEq/L — ABNORMAL LOW (ref 3.5–5.1)
Sodium: 141 mEq/L (ref 135–145)
Total Bilirubin: 0.4 mg/dL (ref 0.2–1.2)
Total Protein: 7.4 g/dL (ref 6.0–8.3)

## 2023-02-19 LAB — HEMOGLOBIN A1C: Hgb A1c MFr Bld: 5.7 % (ref 4.6–6.5)

## 2023-02-19 LAB — TSH: TSH: 1.26 u[IU]/mL (ref 0.35–5.50)

## 2023-02-19 MED ORDER — SPIRONOLACTONE 25 MG PO TABS: 25.0000 mg | ORAL_TABLET | Freq: Every day | ORAL | 0 refills | Status: DC

## 2023-02-19 NOTE — Patient Instructions (Addendum)
Healthy Choice "low carb power bowl"  entrees and  "Steamer" entrees are are great low carb entrees that microwave in 5 minutes    and are $4 or less ,  <200 cal   BP is under control as long as it stays < 130/80  Referral to [facility] in process for thyroid nodule follow up  PAP smear is due

## 2023-02-19 NOTE — Assessment & Plan Note (Signed)
10 yr risk assessment for cardiac events  using the AHA risk calculator is 2% , and there is no evidence of atherosclerosis by prior imaging.  No treatment required.    Lab Results  Component Value Date   CHOL 235 (H) 02/19/2023   HDL 50.60 02/19/2023   LDLCALC 153 (H) 02/19/2023   LDLDIRECT 169.0 08/18/2022   TRIG 159.0 (H) 02/19/2023   CHOLHDL 5 02/19/2023

## 2023-02-19 NOTE — Assessment & Plan Note (Addendum)
Reasonably Well controlled on current regimen of amlodipine 5 mg , losartan 100 mg and metoprolol.  Intolerant of diuretics due to recurrent hypokalemia. Likely aggravated by hyperaldosteronism.  Will add spironolactone   Lab Results  Component Value Date   CREATININE 0.82 02/19/2023   Lab Results  Component Value Date   NA 141 02/19/2023   K 3.4 (L) 02/19/2023   CL 102 02/19/2023   CO2 30 02/19/2023

## 2023-02-19 NOTE — Progress Notes (Signed)
Subjective:  Patient ID: Gabrielle Nelson, female    DOB: Oct 20, 1970  Age: 52 y.o. MRN: [id number]  CC: The primary encounter diagnosis was Pap smear for cervical cancer screening. Diagnoses of Multiple thyroid nodules, Morbid obesity (HCC), Essential hypertension, Encounter for immunization, Hyperaldosteronism (HCC), Pure hypercholesterolemia, Paroxysmal supraventricular tachycardia, and OSA (obstructive sleep apnea) were also pertinent to this visit.   HPI Gabrielle Nelson presents for  Chief Complaint  Patient presents with   Medical Management of Chronic Issues    6 mnth    1) morbid obesity:  barriers explored : works in [employer] [address]. Not exercising due to commute  and chronic orthopedic  issues.  .  Has cut out sodas and [employer] and is down 3 lbs  2) HTN: Patient is taking her medications (amlodipine, losartan and metoprolol)   as prescribed and notes no adverse effects.  Home BP readings have been done about once per week and are  generally < 130/80.  Marland Kitchen    3) PAP smear :  last one 2020 at [facility]..  due now   Outpatient Medications Prior to Visit  Medication Sig Dispense Refill   Acetaminophen 500 MG coapsule      amLODipine (NORVASC) 10 MG tablet TAKE 1 TABLET(10 MG) BY MOUTH DAILY 90 tablet 1   aspirin EC 81 MG tablet Take 81 mg by mouth daily. Swallow whole.     Cholecalciferol (VITAMIN D3) 1000 units CAPS Take 2,000 Units by mouth daily.     fexofenadine (ALLEGRA) 180 MG tablet Take 180 mg by mouth daily.     fish oil-omega-3 fatty acids 1000 MG capsule Take 1,200 mg by mouth 4 (four) times daily.     glucosamine-chondroitin 500-400 MG tablet Take 2 tablets by mouth daily.     losartan (COZAAR) 100 MG tablet Take 1 tablet (100 mg total) by mouth daily. 90 tablet 1   meloxicam (MOBIC) 15 MG tablet Take 1 tablet (15 mg total) by mouth daily. 90 tablet 0   metoprolol succinate (TOPROL-XL) 25 MG 24 hr tablet TAKE 1 TABLET(25 MG) BY MOUTH DAILY WITH OR  IMMEDIATELY FOLLOWING A MEAL 90 tablet 1   traMADol (ULTRAM) 50 MG tablet Take 1 tablet (50 mg total) by mouth every six (6) hours as needed for moderate pain max of 3 per day 90 tablet 5   venlafaxine XR (EFFEXOR-XR) 75 MG 24 hr capsule TAKE 1 CAPSULE(75 MG) BY MOUTH DAILY WITH BREAKFAST 90 capsule 1   No facility-administered medications prior to visit.    Review of Systems;  Patient denies headache, fevers, malaise, unintentional weight loss, skin rash, eye pain, sinus congestion and sinus pain, sore throat, dysphagia,  hemoptysis , cough, dyspnea, wheezing, chest pain, palpitations, orthopnea, edema, abdominal pain, nausea, melena, diarrhea, constipation, flank pain, dysuria, hematuria, urinary  Frequency, nocturia, numbness, tingling, seizures,  Focal weakness, Loss of consciousness,  Tremor, insomnia, depression, anxiety, and suicidal ideation.      Objective:  BP 128/78   Pulse 83   Temp (!) 97.4 F (36.3 C) (Oral)   Ht 5\' 4"  (1.626 m)   Wt 216 lb 12.8 oz (98.3 kg)   SpO2 96%   BMI 37.21 kg/m   BP Readings from Last 3 Encounters:  02/19/23 128/78  01/09/23 122/76  11/06/22 130/82    Wt Readings from Last 3 Encounters:  02/19/23 216 lb 12.8 oz (98.3 kg)  01/09/23 219 lb 3.2 oz (99.4 kg)  11/06/22 223 lb (  101.2 kg)    Physical Exam Vitals reviewed.  Constitutional:      General: She is not in acute distress.    Appearance: Normal appearance. She is normal weight. She is not ill-appearing, toxic-appearing or diaphoretic.  HENT:     Head: Normocephalic.  Eyes:     General: No scleral icterus.       Right eye: No discharge.        Left eye: No discharge.     Conjunctiva/sclera: Conjunctivae normal.  Cardiovascular:     Rate and Rhythm: Normal rate and regular rhythm.     Heart sounds: Normal heart sounds.  Pulmonary:     Effort: Pulmonary effort is normal. No respiratory distress.     Breath sounds: Normal breath sounds.  Musculoskeletal:        General:  Normal range of motion.  Skin:    General: Skin is warm and dry.  Neurological:     General: No focal deficit present.     Mental Status: She is alert and oriented to person, place, and time. Mental status is at baseline.  Psychiatric:        Mood and Affect: Mood normal.        Behavior: Behavior normal.        Thought Content: Thought content normal.        Judgment: Judgment normal.    Lab Results  Component Value Date   HGBA1C 5.7 02/19/2023   HGBA1C 5.9 08/18/2022   HGBA1C 5.9 01/13/2022    Lab Results  Component Value Date   CREATININE 0.82 02/19/2023   CREATININE 0.84 08/18/2022   CREATININE 0.95 01/13/2022    Lab Results  Component Value Date   WBC 5.0 01/08/2014   HGB 12.9 01/08/2014   HCT 37.9 01/08/2014   PLT 220.0 01/08/2014   GLUCOSE 104 (H) 02/19/2023   CHOL 235 (H) 02/19/2023   TRIG 159.0 (H) 02/19/2023   HDL 50.60 02/19/2023   LDLDIRECT 169.0 08/18/2022   LDLCALC 153 (H) 02/19/2023   ALT 31 02/19/2023   AST 27 02/19/2023   NA 141 02/19/2023   K 3.4 (L) 02/19/2023   CL 102 02/19/2023   CREATININE 0.82 02/19/2023   BUN 12 02/19/2023   CO2 30 02/19/2023   TSH 1.26 02/19/2023   HGBA1C 5.7 02/19/2023   MICROALBUR 1.7 02/19/2023    MM 3D SCREEN BREAST BILATERAL  Result Date: 10/05/2022 CLINICAL DATA:  Screening. EXAM: DIGITAL SCREENING BILATERAL MAMMOGRAM WITH TOMOSYNTHESIS AND CAD TECHNIQUE: Bilateral screening digital craniocaudal and mediolateral oblique mammograms were obtained. Bilateral screening digital breast tomosynthesis was performed. The images were evaluated with computer-aided detection. COMPARISON:  Previous exam(s). ACR Breast Density Category b: There are scattered areas of fibroglandular density. FINDINGS: There are no findings suspicious for malignancy. IMPRESSION: No mammographic evidence of malignancy. A result letter of this screening mammogram will be mailed directly to the patient. RECOMMENDATION: Screening mammogram in one year.  (Code:SM-B-01Y) BI-RADS CATEGORY  1: Negative. Electronically Signed   By: Beckie Salts M.D.   On: 10/05/2022 12:53    Assessment & Plan:  .Pap smear for cervical cancer screening  Multiple thyroid nodules -     Ambulatory referral to Endocrinology -     TSH  Morbid obesity (HCC) Assessment & Plan: Complicated by mild OSA and  hypertension  encouraged to exercise , continue restriction of sugar and high calorie drinks   Orders: -     Hemoglobin A1c  Essential hypertension Assessment & Plan: Reasonably  Well controlled on current regimen of amlodipine 5 mg , losartan 100 mg and metoprolol.  Intolerant of diuretics due to recurrent hypokalemia. Likely aggravated by hyperaldosteronism.  Will add spironolactone   Lab Results  Component Value Date   CREATININE 0.82 02/19/2023   Lab Results  Component Value Date   NA 141 02/19/2023   K 3.4 (L) 02/19/2023   CL 102 02/19/2023   CO2 30 02/19/2023     Orders: -     Comprehensive metabolic panel -     Microalbumin / creatinine urine ratio -     Lipid panel  Encounter for immunization -     Tdap vaccine greater than or equal to 7yo IM  Hyperaldosteronism (HCC) Assessment & Plan: Suggested by recurrent hypokalemia despite ise of ARB.  Adding spironolactone    Pure hypercholesterolemia Assessment & Plan: 10 yr risk assessment for cardiac events  using the AHA risk calculator is 2% , and there is no evidence of atherosclerosis by prior imaging.  No treatment required.    Lab Results  Component Value Date   CHOL 235 (H) 02/19/2023   HDL 50.60 02/19/2023   LDLCALC 153 (H) 02/19/2023   LDLDIRECT 169.0 08/18/2022   TRIG 159.0 (H) 02/19/2023   CHOLHDL 5 02/19/2023      Paroxysmal supraventricular tachycardia Assessment & Plan: The 6 minute walk on sleep study report confirms deconditioning .  Continue low dose metoprolol    OSA (obstructive sleep apnea) Assessment & Plan: She has deferred CPAP for now given the mild  findings on recent home sleep study    Other orders -     Spironolactone; Take 1 tablet (25 mg total) by mouth daily.  Dispense: 90 tablet; Refill: 0     I provided 30 minutes of face-to-face time during this encounter reviewing patient's last visit with me, patient's  most recent home sleep study and PFT's,  recent surgical and non surgical procedures, previous  labs and imaging studies, counseling on currently addressed issues,  and post visit ordering to diagnostics and therapeutics .   Follow-up: Return in about 6 months (around 08/21/2023).   Sherlene Shams, MD

## 2023-02-19 NOTE — Assessment & Plan Note (Signed)
Suggested by recurrent hypokalemia despite ise of ARB.  Adding spironolactone

## 2023-02-19 NOTE — Assessment & Plan Note (Addendum)
Complicated by mild OSA and  hypertension  encouraged to exercise , continue restriction of sugar and high calorie drinks

## 2023-02-19 NOTE — Assessment & Plan Note (Signed)
She has deferred CPAP for now given the mild findings on recent home sleep study

## 2023-02-19 NOTE — Assessment & Plan Note (Signed)
The 6 minute walk on sleep study report confirms deconditioning .  Continue low dose metoprolol

## 2023-02-26 ENCOUNTER — Encounter: Payer: Self-pay | Admitting: Internal Medicine

## 2023-02-26 NOTE — Telephone Encounter (Signed)
noted 

## 2023-02-26 NOTE — Telephone Encounter (Signed)
Referral for Endocrinology was supposed to be for the [facility] clinic in [place] Dr. Gershon Crane.

## 2023-04-03 ENCOUNTER — Encounter: Payer: Self-pay | Admitting: Internal Medicine

## 2023-04-04 ENCOUNTER — Emergency Department
Admission: EM | Admit: 2023-04-04 | Discharge: 2023-04-04 | Disposition: A | Discharge status: Home / Self Care | Payer: BC Managed Care – PPO | Attending: Emergency Medicine | Admitting: Emergency Medicine

## 2023-04-04 ENCOUNTER — Encounter: Payer: Self-pay | Admitting: Emergency Medicine

## 2023-04-04 DIAGNOSIS — L03211 Cellulitis of face: Secondary | ICD-10-CM | POA: Insufficient documentation

## 2023-04-04 DIAGNOSIS — R220 Localized swelling, mass and lump, head: Secondary | ICD-10-CM | POA: Diagnosis present

## 2023-04-04 MED ORDER — PREDNISONE 20 MG PO TABS: 60.0000 mg | ORAL_TABLET | Freq: Every day | ORAL | 0 refills | Status: AC

## 2023-04-04 MED ORDER — CLINDAMYCIN HCL 150 MG PO CAPS: 450.0000 mg | ORAL_CAPSULE | Freq: Three times a day (TID) | ORAL | 0 refills | Status: AC

## 2023-04-04 MED ORDER — PREDNISONE 20 MG PO TABS
60.0000 mg | ORAL_TABLET | ORAL | Status: AC
  Administered 2023-04-04: 60 mg via ORAL
  Filled 2023-04-04: qty 3

## 2023-04-04 MED ORDER — CLINDAMYCIN HCL 150 MG PO CAPS
450.0000 mg | ORAL_CAPSULE | Freq: Once | ORAL | Status: AC
  Administered 2023-04-04: 450 mg via ORAL
  Filled 2023-04-04: qty 3

## 2023-04-04 NOTE — ED Provider Notes (Signed)
[facility] Provider Note    Event Date/Time   First MD Initiated Contact with Patient 04/04/23 [phone]     (approximate)   History   Facial Swelling   HPI Gabrielle Nelson is a 52 y.o. female with no significant chronic medical issues who presents for evaluation of about 2 days of relatively quickly worsening redness, pain, and swelling to her nose.  She states that about 2 days ago she had a spot on or within her nose that was starting to become painful.  Over the course of the next day her entire nose has become red and swollen and exquisitely tender to touch.  It seems to be spreading to the top of her nose and creating some pain in the rest of her face as well.  There is no open wound or drainage and she cannot think of anything that she may have done to break the skin or cause any issues.  No problems inside her nose other than some tenderness.  She denies fever, nausea, and vomiting.  No visual changes.  No diabetes or other immunocompromise.     Physical Exam   Triage Vital Signs: ED Triage Vitals [04/04/23 0249]  Encounter Vitals Group     BP (!) 146/111     Systolic BP Percentile      Diastolic BP Percentile      Pulse Rate 84     Resp 16     Temp 98.3 F (36.8 C)     Temp Source Oral     SpO2 100 %     Weight 98 kg (216 lb 0.8 oz)     Height 1.626 m (5\' 4" )     Head Circumference      Peak Flow      Pain Score 7     Pain Loc      Pain Education      Exclude from Growth Chart     Most recent vital signs: Vitals:   04/04/23 0249  BP: (!) 146/111  Pulse: 84  Resp: 16  Temp: 98.3 F (36.8 C)  SpO2: 100%    General: Awake, no distress.  CV:  Good peripheral perfusion.  Resp:  Normal effort. Speaking easily and comfortably, no accessory muscle usage nor intercostal retractions.   Abd:  No distention.  Face:  The patient's nose is very erythematous and somewhat edematous.  Highly tender to palpation.  No crepitus or bullae or  blisters.  No obvious intranasal abnormality.  No tenderness to the bridge of the nose but most of the nose itself is involved and painful.  No purulence.  See photo below:     ED Results / Procedures / Treatments   Labs (all labs ordered are listed, but only abnormal results are displayed) Labs Reviewed - No data to display     PROCEDURES:  Critical Care performed: No  Procedures    IMPRESSION / MDM / ASSESSMENT AND PLAN / ED COURSE  I reviewed the triage vital signs and the nursing notes.                              Differential diagnosis includes, but is not limited to, facial cellulitis, facial abscess, intranasal or sinus abscess.  Patient's presentation is most consistent with acute, uncomplicated illness.   Interventions/Medications given:  Medications  predniSONE (DELTASONE) tablet 60 mg (60 mg Oral Given 04/04/23 0417)  clindamycin (CLEOCIN)  capsule 450 mg (450 mg Oral Given 04/04/23 0417)    (Note:  hospital course my include additional interventions and/or labs/studies not listed above.)   Vital signs are stable other than hypertension which is likely situational.  No evidence of sepsis.  Symptoms are most consistent with a facial cellulitis, likely from a strep pathogen.  I talked with her about obtaining a CT scan but we both agree that is likely unnecessary and she would rather start on medication and follow-up with ENT.  To cover both staph and strep species, I am starting her on clindamycin and gave her first dose of 450 mg in the ED.  I wrote her prescription for a 7-day course of 450 mg clindamycin 3 times daily.  I also gave her a dose of prednisone 60 mg and a 5-day burst course of 60 mg daily to try and reduce the inflammation and spread of the cellulitis.  I gave her strict follow-up recommendations and return precautions should she develop new or worsening symptoms and she agrees with the plan.         FINAL CLINICAL IMPRESSION(S) / ED  DIAGNOSES   Final diagnoses:  Facial cellulitis     Rx / DC Orders   ED Discharge Orders          Ordered    clindamycin (CLEOCIN) 150 MG capsule  3 times daily        04/04/23 0408    predniSONE (DELTASONE) 20 MG tablet  Daily with breakfast        04/04/23 0408             Note:  This document was prepared using Dragon voice recognition software and may include unintentional dictation errors.   Gabrielle Rose, MD 04/04/23 [phone]

## 2023-04-04 NOTE — ED Triage Notes (Signed)
Pt c/o nasal swelling, redness and pain x2 days. Pt reports area started with small bump and then pt woke this AM with swelling, redness and pain. Pt denies manipulating or self treating at home. Pt works in [employer]. No previous MRSA infections noted.

## 2023-04-04 NOTE — Discharge Instructions (Addendum)
Please take the full course of both prescribed medications unless otherwise directed by a physician.  We recommend you call the office of Dr. Elenore Rota to schedule a follow-up appointment in the ear, nose, and throat (ENT) specialty clinic with him or one of his colleagues.  You can schedule the next available appointment but you may have more benefit if you see them in a few days (preferably by the end of this week) after you have been on the medication for a short period of time.  Return immediately to the emergency department if you develop new or worsening symptoms that concern you.

## 2023-04-04 NOTE — Telephone Encounter (Signed)
Pt has already been advised to call and make an appt for this via mychart.

## 2023-04-05 NOTE — Telephone Encounter (Signed)
Called pt and she stated that she is following up with ENT.

## 2023-04-06 ENCOUNTER — Telehealth: Payer: Self-pay

## 2023-04-06 NOTE — Transitions of Care (Post Inpatient/ED Visit) (Signed)
I spoke with pt; pt was seen [facility] ED on 04/04/23 for cellulitis of face; pt is better; redness & swelling of nose is almost gone and pt taking abx and prednisone as prescribed. pt already has appt with Dr Genevive Bi 05/01/23. UC & ED precautions given and pt voiced understanding. Pt will call [facility] if anything needed. Sending note to Dr Darrick Huntsman        04/06/2023  Name: Gabrielle Nelson MRN: [id number] DOB: 04-19-1971  Today's TOC FU Call Status: Today's TOC FU Call Status:: Successful TOC FU Call Completed TOC FU Call Complete Date: 04/06/23  Transition Care Management Follow-up Telephone Call Date of Discharge: 04/04/23 Discharge Facility: [facility]) Type of Discharge: Emergency Department Reason for ED Visit:  (cellulitis of face; pt is better; redness & swelling of nose is almost gone and taking abx and prednisone as prescribed. pt already has appt with Dr Genevive Bi  05/01/23.) How have you been since you were released from the hospital?: Better Any questions or concerns?: No  Items Reviewed: Did you receive and understand the discharge instructions provided?: Yes Medications obtained,verified, and reconciled?: Partial Review Completed Reason for Partial Mediation Review: pt just discussed taking clindamycine 150 mg taking 3 caps tid for 7 days and prednisone 20 mg taking 3 tabs with breakfast for 5 days. Any new allergies since your discharge?: No Dietary orders reviewed?: NA Do you have support at home?: Yes People in Home: child(ren), adult Name of Support/Comfort Primary Source: Terrence  Medications Reviewed Today: Medications Reviewed Today   Medications were not reviewed in this encounter     Home Care and Equipment/Supplies: Were Home Health Services Ordered?: NA Any new equipment or medical supplies ordered?: NA  Functional Questionnaire: Do you need assistance with bathing/showering or dressing?: No Do you need assistance with meal  preparation?: No Do you need assistance with eating?: No Do you have difficulty maintaining continence: No Do you need assistance with getting out of bed/getting out of a chair/moving?: No Do you have difficulty managing or taking your medications?: No  Follow up appointments reviewed: PCP Follow-up appointment confirmed?: NA Specialist Hospital Follow-up appointment confirmed?: Yes Date of Specialist follow-up appointment?: 04/11/23 Follow-Up Specialty Provider:: Dr Elenore Rota Do you need transportation to your follow-up appointment?: No Do you understand care options if your condition(s) worsen?: Yes-patient verbalized understanding    SIGNATURE Lewanda Rife, LPN

## 2023-04-15 ENCOUNTER — Other Ambulatory Visit: Payer: Self-pay | Admitting: Internal Medicine

## 2023-05-20 ENCOUNTER — Other Ambulatory Visit: Payer: Self-pay | Admitting: Internal Medicine

## 2023-07-08 ENCOUNTER — Other Ambulatory Visit: Payer: Self-pay | Admitting: Internal Medicine

## 2023-07-09 NOTE — Telephone Encounter (Signed)
LOV: 02/19/2023   NOV: 08/24/2023

## 2023-08-12 ENCOUNTER — Other Ambulatory Visit: Payer: Self-pay | Admitting: Internal Medicine

## 2023-08-22 ENCOUNTER — Ambulatory Visit: Payer: BC Managed Care – PPO | Admitting: Internal Medicine

## 2023-08-24 ENCOUNTER — Ambulatory Visit: Payer: BC Managed Care – PPO | Admitting: Internal Medicine

## 2023-08-24 ENCOUNTER — Telehealth: Payer: Self-pay

## 2023-08-24 VITALS — BP 132/84 | HR 92 | Ht 64.0 in | Wt 218.8 lb

## 2023-08-24 DIAGNOSIS — E269 Hyperaldosteronism, unspecified: Secondary | ICD-10-CM

## 2023-08-24 DIAGNOSIS — M25561 Pain in right knee: Secondary | ICD-10-CM

## 2023-08-24 DIAGNOSIS — E876 Hypokalemia: Secondary | ICD-10-CM | POA: Diagnosis not present

## 2023-08-24 DIAGNOSIS — Z1231 Encounter for screening mammogram for malignant neoplasm of breast: Secondary | ICD-10-CM

## 2023-08-24 DIAGNOSIS — G8929 Other chronic pain: Secondary | ICD-10-CM

## 2023-08-24 DIAGNOSIS — I10 Essential (primary) hypertension: Secondary | ICD-10-CM | POA: Diagnosis not present

## 2023-08-24 MED ORDER — LOSARTAN POTASSIUM 100 MG PO TABS: 100.0000 mg | ORAL_TABLET | Freq: Every day | ORAL | 1 refills | Status: DC

## 2023-08-24 MED ORDER — VENLAFAXINE HCL ER 150 MG PO CP24: ORAL_CAPSULE | ORAL | 2 refills | Status: DC

## 2023-08-24 MED ORDER — AMLODIPINE BESYLATE 10 MG PO TABS: ORAL_TABLET | ORAL | 1 refills | Status: DC

## 2023-08-24 NOTE — Assessment & Plan Note (Signed)
Suggested by recurrent hypokalemia despite use of ARB.  Adding spironolactone   Lab Results  Component Value Date   NA 141 02/19/2023   K 3.4 (L) 02/19/2023   CL 102 02/19/2023   CO2 30 02/19/2023

## 2023-08-24 NOTE — Patient Instructions (Signed)
PLEASE CHECK YOUR BP A FEW TIMES AT HOME AND SEND ME READINGS  RETURN FOR A POTASSIUM CHECK SOON  WE'LL DO YOUR CPE AND PAP SMEAR IN 6 MONTHS

## 2023-08-24 NOTE — Telephone Encounter (Signed)
Copied from CRM [id number]. Topic: Clinical - Prescription Issue >> Aug 24, 2023  4:44 PM Florestine Avers wrote: Reason for CRM: Pharmacy was called to see about the instructions for the medication, pharmacist was a little unclear and would like a call from the doctor or nurse to confirm proper instructions. Can be reached directly @ [phone].

## 2023-08-24 NOTE — Telephone Encounter (Signed)
Pharmacy is needing clarification on the Venlafaxine directions.

## 2023-08-24 NOTE — Progress Notes (Unsigned)
Subjective:  Patient ID: Gabrielle Nelson, female    DOB: 05-01-1971  Age: 52 y.o. MRN: [id number]  CC: There were no encounter diagnoses.   HPI Gabrielle Nelson presents for  Chief Complaint  Patient presents with   Medical Management of Chronic Issues    6 month follow up    1) HTN:  no longer taking spironolactoe after endo told her to stop it because she only had hypokalemia once.   2) GAD:  stressed out by her unfriendly coworkers in her new job in the [place] .  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 .  Starting to dread going to work .  Every time she makes a mistake she gets reported to management despite being poorly training  3) obesity:   no change in weight.  Not exercising   4)        Outpatient Medications Prior to Visit  Medication Sig Dispense Refill   Acetaminophen 500 MG coapsule      amLODipine (NORVASC) 10 MG tablet TAKE 1 TABLET(10 MG) BY MOUTH DAILY 90 tablet 1   aspirin EC 81 MG tablet Take 81 mg by mouth daily. Swallow whole.     Cholecalciferol (VITAMIN D3) 1000 units CAPS Take 2,000 Units by mouth daily.     fexofenadine (ALLEGRA) 180 MG tablet Take 180 mg by mouth daily.     fish oil-omega-3 fatty acids 1000 MG capsule Take 1,200 mg by mouth 4 (four) times daily.     losartan (COZAAR) 100 MG tablet Take 1 tablet (100 mg total) by mouth daily. 90 tablet 1   meloxicam (MOBIC) 15 MG tablet Take 1 tablet (15 mg total) by mouth daily. 90 tablet 0   metoprolol succinate (TOPROL-XL) 25 MG 24 hr tablet Take 1 tablet (25 mg total) by mouth daily with, or immediately following a meal 90 tablet 1   traMADol (ULTRAM) 50 MG tablet Take 1 tablet (50 mg total) by mouth every 6 (six) hours as needed for severe pain (pain score 7-10). 90 tablet 5   venlafaxine XR (EFFEXOR-XR) 75 MG 24 hr capsule Take 1 capsule (75 mg total) by mouth daily with breakfast 90 capsule 1   glucosamine-chondroitin 500-400 MG tablet Take 2 tablets by mouth daily.     spironolactone (ALDACTONE) 25  MG tablet Take 1 tablet (25 mg total) by mouth daily. 90 tablet 0   No facility-administered medications prior to visit.    Review of Systems;  Patient denies headache, fevers, malaise, unintentional weight loss, skin rash, eye pain, sinus congestion and sinus pain, sore throat, dysphagia,  hemoptysis , cough, dyspnea, wheezing, chest pain, palpitations, orthopnea, edema, abdominal pain, nausea, melena, diarrhea, constipation, flank pain, dysuria, hematuria, urinary  Frequency, nocturia, numbness, tingling, seizures,  Focal weakness, Loss of consciousness,  Tremor, insomnia, depression, anxiety, and suicidal ideation.      Objective:  BP 132/84   Pulse 92   Ht 5\' 4"  (1.626 m)   Wt 218 lb 12.8 oz (99.2 kg)   SpO2 98%   BMI 37.56 kg/m   BP Readings from Last 3 Encounters:  08/24/23 132/84  04/04/23 (!) 146/111  02/19/23 128/78    Wt Readings from Last 3 Encounters:  08/24/23 218 lb 12.8 oz (99.2 kg)  04/04/23 216 lb 0.8 oz (98 kg)  02/19/23 216 lb 12.8 oz (98.3 kg)    Physical Exam  Lab Results  Component Value Date   HGBA1C 5.7 02/19/2023   HGBA1C 5.9 08/18/2022  HGBA1C 5.9 01/13/2022    Lab Results  Component Value Date   CREATININE 0.82 02/19/2023   CREATININE 0.84 08/18/2022   CREATININE 0.95 01/13/2022    Lab Results  Component Value Date   WBC 5.0 01/08/2014   HGB 12.9 01/08/2014   HCT 37.9 01/08/2014   PLT 220.0 01/08/2014   GLUCOSE 104 (H) 02/19/2023   CHOL 235 (H) 02/19/2023   TRIG 159.0 (H) 02/19/2023   HDL 50.60 02/19/2023   LDLDIRECT 169.0 08/18/2022   LDLCALC 153 (H) 02/19/2023   ALT 31 02/19/2023   AST 27 02/19/2023   NA 141 02/19/2023   K 3.4 (L) 02/19/2023   CL 102 02/19/2023   CREATININE 0.82 02/19/2023   BUN 12 02/19/2023   CO2 30 02/19/2023   TSH 1.26 02/19/2023   HGBA1C 5.7 02/19/2023   MICROALBUR 1.7 02/19/2023    No results found.  Assessment & Plan:  .There are no diagnoses linked to this encounter.   I provided 30  minutes of face-to-face time during this encounter reviewing patient's last visit with me, patient's  most recent visit with cardiology,  nephrology,  and neurology,  recent surgical and non surgical procedures, previous  labs and imaging studies, counseling on currently addressed issues,  and post visit ordering to diagnostics and therapeutics .   Follow-up: No follow-ups on file.   Sherlene Shams, MD

## 2023-08-26 NOTE — Assessment & Plan Note (Signed)
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  Aggravted by weight gain  .  Continue use of tramadol prn and encouraged to  lose weight and begin exercising to strengthen quads.

## 2023-08-26 NOTE — Assessment & Plan Note (Signed)
Reasonably Well controlled on current regimen of amlodipine 5 mg , losartan 100 mg and metoprolol.  Intolerant of diuretics due to recurrent hypokalemia. Repeat potassium without diuretics was low but has not been repeated   Lab Results  Component Value Date   CREATININE 0.82 02/19/2023   Lab Results  Component Value Date   NA 141 02/19/2023   K 3.4 (L) 02/19/2023   CL 102 02/19/2023   CO2 30 02/19/2023

## 2023-08-27 ENCOUNTER — Other Ambulatory Visit (INDEPENDENT_AMBULATORY_CARE_PROVIDER_SITE_OTHER): Payer: BC Managed Care – PPO

## 2023-08-27 DIAGNOSIS — E876 Hypokalemia: Secondary | ICD-10-CM | POA: Diagnosis not present

## 2023-08-27 LAB — BASIC METABOLIC PANEL
BUN: 13 mg/dL (ref 6–23)
CO2: 30 meq/L (ref 19–32)
Calcium: 10 mg/dL (ref 8.4–10.5)
Chloride: 100 meq/L (ref 96–112)
Creatinine, Ser: 0.9 mg/dL (ref 0.40–1.20)
GFR: 73.14 mL/min (ref >60.00)
Glucose, Bld: 124 mg/dL — ABNORMAL HIGH (ref 70–99)
Potassium: 4.4 meq/L (ref 3.5–5.1)
Sodium: 140 meq/L (ref 135–145)

## 2023-08-27 NOTE — Telephone Encounter (Signed)
noted 

## 2023-10-28 ENCOUNTER — Other Ambulatory Visit: Payer: Self-pay | Admitting: Internal Medicine

## 2023-11-30 ENCOUNTER — Encounter: Payer: Self-pay | Admitting: Internal Medicine

## 2023-11-30 MED ORDER — METOPROLOL SUCCINATE ER 25 MG PO TB24: ORAL_TABLET | ORAL | 1 refills | Status: DC

## 2024-02-01 ENCOUNTER — Encounter: Payer: Self-pay | Admitting: Internal Medicine

## 2024-02-04 ENCOUNTER — Telehealth: Payer: Self-pay | Admitting: Internal Medicine

## 2024-02-04 MED ORDER — MELOXICAM 15 MG PO TABS: 15.0000 mg | ORAL_TABLET | Freq: Every day | ORAL | 0 refills | Status: DC

## 2024-02-04 MED ORDER — VENLAFAXINE HCL ER 150 MG PO CP24: 150.0000 mg | ORAL_CAPSULE | Freq: Every day | ORAL | 0 refills | Status: DC

## 2024-02-15 ENCOUNTER — Other Ambulatory Visit: Payer: Self-pay | Admitting: Internal Medicine

## 2024-02-15 MED ORDER — TRAMADOL HCL 50 MG PO TABS: 50.0000 mg | ORAL_TABLET | Freq: Four times a day (QID) | ORAL | 0 refills | Status: AC | PRN

## 2024-02-15 NOTE — Telephone Encounter (Signed)
 Requesting: TRAMADOL  Contract: No UDS: NO Last Visit: 08/24/2023 Next Visit: Visit date not found Last Refill: E-Prescribing Status: Receipt confirmed by pharmacy (07/10/2023  8:17 AM EST   Pt currently has no insurance, wanting refill

## 2024-02-22 ENCOUNTER — Encounter: Payer: BC Managed Care – PPO | Admitting: Internal Medicine

## 2024-03-28 ENCOUNTER — Other Ambulatory Visit: Payer: Self-pay | Admitting: Internal Medicine

## 2024-04-27 ENCOUNTER — Other Ambulatory Visit: Payer: Self-pay | Admitting: Internal Medicine

## 2024-05-02 ENCOUNTER — Other Ambulatory Visit: Payer: Self-pay | Admitting: Internal Medicine

## 2024-05-09 ENCOUNTER — Encounter: Payer: Self-pay | Admitting: Internal Medicine

## 2024-05-14 NOTE — Telephone Encounter (Signed)
Pt has picked this up

## 2024-05-25 ENCOUNTER — Other Ambulatory Visit: Payer: Self-pay | Admitting: Internal Medicine

## 2024-05-27 NOTE — Telephone Encounter (Signed)
Attempted to call pt- No answer/No voicemail.  

## 2024-05-28 NOTE — Telephone Encounter (Signed)
 Please mail letter for pt to scheduled ans appt and to inform that there will be no additional refills until seen and send in 90 day supply with no refills. Pt is within the one year to refill until December.

## 2024-05-29 NOTE — Telephone Encounter (Signed)
Letter printed and placed to be mailed.

## 2024-06-26 ENCOUNTER — Encounter: Payer: Self-pay | Admitting: Internal Medicine

## 2024-06-27 ENCOUNTER — Other Ambulatory Visit: Payer: Self-pay | Admitting: Internal Medicine

## 2024-06-27 DIAGNOSIS — Z1231 Encounter for screening mammogram for malignant neoplasm of breast: Secondary | ICD-10-CM

## 2024-06-27 MED ORDER — METOPROLOL SUCCINATE ER 25 MG PO TB24: ORAL_TABLET | ORAL | 0 refills | Status: AC

## 2024-06-27 MED ORDER — AMLODIPINE BESYLATE 10 MG PO TABS: ORAL_TABLET | ORAL | 1 refills | Status: AC

## 2024-06-27 MED ORDER — VENLAFAXINE HCL ER 150 MG PO CP24: 150.0000 mg | ORAL_CAPSULE | Freq: Every day | ORAL | 0 refills | Status: AC

## 2024-06-27 MED ORDER — LOSARTAN POTASSIUM 100 MG PO TABS: 100.0000 mg | ORAL_TABLET | Freq: Every day | ORAL | 0 refills | Status: DC

## 2024-08-04 ENCOUNTER — Ambulatory Visit
Admission: RE | Admit: 2024-08-04 | Discharge: 2024-08-04 | Disposition: A | Payer: PRIVATE HEALTH INSURANCE | Source: Ambulatory Visit | Attending: Internal Medicine | Admitting: Internal Medicine

## 2024-08-04 ENCOUNTER — Encounter: Payer: Self-pay | Admitting: Internal Medicine

## 2024-08-04 DIAGNOSIS — Z1231 Encounter for screening mammogram for malignant neoplasm of breast: Secondary | ICD-10-CM | POA: Insufficient documentation

## 2024-08-05 NOTE — Telephone Encounter (Signed)
 Refilled: 05/06/2024 Last OV: 08/24/2023 Next OV: 10/14/2024  Last BMP was 08/27/2023

## 2024-08-06 MED ORDER — MELOXICAM 15 MG PO TABS: 15.0000 mg | ORAL_TABLET | Freq: Every day | ORAL | 0 refills | Status: AC

## 2024-09-21 ENCOUNTER — Other Ambulatory Visit: Payer: Self-pay | Admitting: Internal Medicine

## 2024-10-15 ENCOUNTER — Encounter: Payer: Self-pay | Admitting: Internal Medicine
